# Patient Record
Sex: Male | Born: 1990 | Race: Black or African American | Hispanic: No | Marital: Single | Smoking: Former smoker
Health system: Southern US, Community
[De-identification: ages and names within clinical notes are randomized; demographics above are authoritative.]

## PROBLEM LIST (undated history)

## (undated) DIAGNOSIS — J9383 Other pneumothorax: Secondary | ICD-10-CM

## (undated) DIAGNOSIS — R001 Bradycardia, unspecified: Secondary | ICD-10-CM

---

## 2015-04-14 DIAGNOSIS — J9383 Other pneumothorax: Secondary | ICD-10-CM

## 2015-04-14 HISTORY — DX: Other pneumothorax: J93.83

## 2015-10-09 HISTORY — PX: CHEST TUBE INSERTION: SHX231

## 2015-11-02 ENCOUNTER — Encounter (HOSPITAL_COMMUNITY): Payer: Self-pay | Admitting: Emergency Medicine

## 2015-11-02 ENCOUNTER — Inpatient Hospital Stay (HOSPITAL_COMMUNITY)
Admission: EM | Admit: 2015-11-02 | Discharge: 2015-11-04 | DRG: 201 | Disposition: A | Discharge status: Home / Self Care | Payer: Self-pay | Attending: Pulmonary Disease | Admitting: Pulmonary Disease

## 2015-11-02 ENCOUNTER — Inpatient Hospital Stay (HOSPITAL_COMMUNITY): Payer: Self-pay

## 2015-11-02 ENCOUNTER — Emergency Department (HOSPITAL_COMMUNITY): Payer: Self-pay

## 2015-11-02 DIAGNOSIS — R0789 Other chest pain: Secondary | ICD-10-CM | POA: Diagnosis present

## 2015-11-02 DIAGNOSIS — J939 Pneumothorax, unspecified: Secondary | ICD-10-CM

## 2015-11-02 DIAGNOSIS — J9383 Other pneumothorax: Principal | ICD-10-CM | POA: Diagnosis present

## 2015-11-02 DIAGNOSIS — Z938 Other artificial opening status: Secondary | ICD-10-CM

## 2015-11-02 LAB — BASIC METABOLIC PANEL
Anion gap: 8 (ref 5–15)
BUN: 13 mg/dL (ref 6–20)
CHLORIDE: 103 mmol/L (ref 101–111)
CO2: 27 mmol/L (ref 22–32)
CREATININE: 1.09 mg/dL (ref 0.61–1.24)
Calcium: 9.9 mg/dL (ref 8.9–10.3)
GFR calc Af Amer: >60 mL/min (ref >60)
GFR calc non Af Amer: >60 mL/min (ref >60)
Glucose, Bld: 100 mg/dL — ABNORMAL HIGH (ref 65–99)
Potassium: 3.8 mmol/L (ref 3.5–5.1)
SODIUM: 138 mmol/L (ref 135–145)

## 2015-11-02 LAB — CBC
HCT: 40.3 % (ref 39.0–52.0)
Hemoglobin: 13.2 g/dL (ref 13.0–17.0)
MCH: 23.2 pg — AB (ref 26.0–34.0)
MCHC: 32.8 g/dL (ref 30.0–36.0)
MCV: 70.8 fL — AB (ref 78.0–100.0)
PLATELETS: 259 10*3/uL (ref 150–400)
RBC: 5.69 MIL/uL (ref 4.22–5.81)
RDW: 16.3 % — AB (ref 11.5–15.5)
WBC: 8.1 10*3/uL (ref 4.0–10.5)

## 2015-11-02 LAB — I-STAT TROPONIN, ED: Troponin i, poc: 0.01 ng/mL (ref 0.00–0.08)

## 2015-11-02 MED ORDER — MORPHINE SULFATE (PF) 2 MG/ML IV SOLN
2.0000 mg | INTRAVENOUS | Status: DC | PRN
  Administered 2015-11-02 (×2): 2 mg via INTRAVENOUS
  Filled 2015-11-02 (×2): qty 1

## 2015-11-02 MED ORDER — FENTANYL CITRATE (PF) 100 MCG/2ML IJ SOLN
100.0000 ug | Freq: Once | INTRAMUSCULAR | Status: AC
  Administered 2015-11-02: 50 ug via INTRAVENOUS
  Filled 2015-11-02: qty 2

## 2015-11-02 MED ORDER — ENOXAPARIN SODIUM 40 MG/0.4ML ~~LOC~~ SOLN
40.0000 mg | SUBCUTANEOUS | Status: DC
  Administered 2015-11-02 – 2015-11-03 (×2): 40 mg via SUBCUTANEOUS
  Filled 2015-11-02 (×2): qty 0.4

## 2015-11-02 MED ORDER — MIDAZOLAM HCL 2 MG/2ML IJ SOLN
2.0000 mg | Freq: Once | INTRAMUSCULAR | Status: AC
  Administered 2015-11-02: 1 mg via INTRAVENOUS
  Filled 2015-11-02: qty 2

## 2015-11-02 MED ORDER — ACETAMINOPHEN 325 MG PO TABS: 650.0000 mg | ORAL_TABLET | Freq: Four times a day (QID) | ORAL | Status: DC | PRN

## 2015-11-02 MED ORDER — MIDAZOLAM HCL 2 MG/2ML IJ SOLN
INTRAMUSCULAR | Status: AC | PRN
  Administered 2015-11-02: 1 mg via INTRAVENOUS

## 2015-11-02 MED ORDER — FENTANYL CITRATE (PF) 100 MCG/2ML IJ SOLN
INTRAMUSCULAR | Status: AC | PRN
  Administered 2015-11-02: 50 ug via INTRAVENOUS

## 2015-11-02 MED ORDER — OXYCODONE-ACETAMINOPHEN 5-325 MG PO TABS
1.0000 | ORAL_TABLET | ORAL | Status: DC | PRN
  Administered 2015-11-02 – 2015-11-04 (×6): 2 via ORAL
  Filled 2015-11-02 (×6): qty 2

## 2015-11-02 NOTE — ED Provider Notes (Signed)
[facility]-EMER[facility]ENCY DEPT Provider Note   CSN: [id number] Arrival date & time: 11/02/15  6295  First Provider Contact:  First MD Initiated Contact with Patient 11/02/15 0840        History   Chief Complaint Chief Complaint  Patient presents with  . Chest Pain    HPI [name] [name] is a 25 y.o. male.  The history is provided by the patient.  Chest Pain   This is a new problem. The current episode started 12 to 24 hours ago. The problem occurs constantly. The problem has been gradually worsening. The pain is associated with breathing. The pain is moderate. The quality of the pain is described as sharp and pressure-like. The pain radiates to the upper back. Associated symptoms include back pain and shortness of breath. Pertinent negatives include no abdominal pain, no cough, no diaphoresis, no dizziness and no palpitations.    History reviewed. No pertinent past medical history.  Patient Active Problem List   Diagnosis Date Noted  . Spontaneous pneumothorax 11/02/2015  . S/P chest tube placement [facility])     History reviewed. No pertinent surgical history.     Home Medications    Prior to Admission medications   Medication Sig Start Date End Date Taking? Authorizing Provider  acetaminophen (TYLENOL) 500 MG tablet Take 1,000 mg by mouth every 6 (six) hours as needed (pain).   Yes Historical Provider, MD    Family History History reviewed. No pertinent family history.  Social History Social History  Substance Use Topics  . Smoking status: Never Smoker  . Smokeless tobacco: Never Used  . Alcohol use No     Allergies   Review of patient's allergies indicates no known allergies.   Review of Systems Review of Systems  Constitutional: Negative for diaphoresis.  Respiratory: Positive for shortness of breath. Negative for cough.   Cardiovascular: Positive for chest pain. Negative for palpitations.  Gastrointestinal: Negative for abdominal pain.  Musculoskeletal: Positive  for back pain.  Neurological: Negative for dizziness.  All other systems reviewed and are negative.   Physical Exam Updated Vital Signs BP 128/70 (BP Location: Left Arm)   Pulse 60   Temp 98.5 F (36.9 C) (Oral)   Resp (!) 21   SpO2 100%   Physical Exam  Constitutional: He appears well-developed and well-nourished. No distress.  HENT:  Head: Normocephalic and atraumatic.  Eyes: Conjunctivae are normal.  Neck: Neck supple.  Cardiovascular: Normal rate and regular rhythm.   No murmur heard. Pulmonary/Chest: Effort normal. No respiratory distress. He has decreased breath sounds in the left upper field, the left middle field and the left lower field.  Abdominal: Soft. There is no tenderness.  Musculoskeletal: He exhibits no edema.  Neurological: He is alert.  Skin: Skin is warm and dry.  Psychiatric: He has a normal mood and affect.  Nursing note and vitals reviewed.    ED Treatments / Results  Labs (all labs ordered are listed, but only abnormal results are displayed) Labs Reviewed  BASIC METABOLIC PANEL - Abnormal; Notable for the following:       Result Value   Glucose, Bld 100 (*)    All other components within normal limits  CBC - Abnormal; Notable for the following:    MCV 70.8 (*)    MCH 23.2 (*)    RDW 16.3 (*)    All other components within normal limits  Rosezena Sensor, ED    EKG  EKG Interpretation  Date/Time:  Tuesday November 02 2015 08:04:00  EDT Ventricular Rate:  81 PR Interval:  134 QRS Duration: 84 QT Interval:  360 QTC Calculation: 418 R Axis:   92 Text Interpretation:  Normal sinus rhythm with sinus arrhythmia Right atrial enlargement Rightward axis Possible Anterior infarct , age undetermined Abnormal ECG No old tracing to compare Confirmed by CAMPOS  MD, Caryn Bee ([id number]) on 11/02/2015 8:41:41 AM       Radiology Dg Chest 2 View  Result Date: 11/02/2015 CLINICAL DATA:  Left upper chest pain since last night EXAM: CHEST  2 VIEW COMPARISON:   None. FINDINGS: There is a moderate-sized left pneumothorax, approximately 20%. Lungs are clear. No effusions. Heart is normal size. No acute bony abnormality. IMPRESSION: Moderate-sized left pneumothorax. Critical Value/emergent results were called by telephone at the time of interpretation on 11/02/2015 at 8:22 am to [facility] , who verbally acknowledged these results. Electronically Signed   By: Charlett Nose M.D.   On: 11/02/2015 08:25  Dg Chest Port 1 View  Result Date: 11/02/2015 CLINICAL DATA:  Pneumothorax.  Status post chest tube placement. EXAM: PORTABLE CHEST 1 VIEW COMPARISON:  Two-view chest x-ray 11/02/2015 FINDINGS: The heart size is normal. A left-sided chest tube has been placed. The pneumothorax resolved. No residual pneumothorax is present. There is no significant airspace disease. IMPRESSION: 1. Resolution of pneumothorax following placement of left-sided chest tube. Electronically Signed   By: Marin Roberts M.D.   On: 11/02/2015 11:41   Procedures Procedures (including critical care time)  Medications Ordered in ED Medications  acetaminophen (TYLENOL) tablet 650 mg (not administered)  oxyCODONE-acetaminophen (PERCOCET/ROXICET) 5-325 MG per tablet 1-2 tablet (not administered)  morphine 2 MG/ML injection 2 mg (2 mg Intravenous Given 11/02/15 1126)  enoxaparin (LOVENOX) injection 40 mg (not administered)  midazolam (VERSED) injection 2 mg (1 mg Intravenous Given 11/02/15 1050)  fentaNYL (SUBLIMAZE) injection 100 mcg (50 mcg Intravenous Given 11/02/15 1050)  midazolam (VERSED) injection (1 mg Intravenous Given 11/02/15 1054)  fentaNYL (SUBLIMAZE) injection (50 mcg Intravenous Given 11/02/15 1054)     Initial Impression / Assessment and Plan / ED Course  I have reviewed the triage vital signs and the nursing notes.  Pertinent labs & imaging results that were available during my care of the patient were reviewed by me and considered in my medical decision making (see  chart for details).  Clinical Course    Previously healthy 25 year old male presenting today for left-sided chest pain and shortness of breath. Reports this is been increasing over the last 2-3 days. Chest x-ray performed through triage and found to have moderate-sized left-sided pneumothorax.  On exam patient is imminently stable in no distress and no signs of tension pneumothorax. CBC BMP and troponin unremarkable with unremarkable ECG.  Pulmonary consulted who evaluated the pt in the ED and placed pigtail catheter with alleviation of PTX.     Admitted to pulmonary for further observation.   Labs, ECG, and images were viewed by myself  incorporated into medical decision making.  Discussed pertinent finding with patient or caregiver prior to discharge with no further questions.  Immediate return precautions given and understood.  Medical decision making supervised by my attending Dr. Patria Mane.   Tery Sanfilippo, MD PGY-3 Emergency Medicine     Final Clinical Impressions(s) / ED Diagnoses   Final diagnoses:  Spontaneous pneumothorax  S/P chest tube placement [facility])    New Prescriptions Current Discharge Medication List       Tery Sanfilippo, MD 11/02/15 8329    Azalia Bilis, MD 11/02/15 [phone]

## 2015-11-02 NOTE — ED Notes (Signed)
Patient transported to X-ray 

## 2015-11-02 NOTE — ED Notes (Signed)
Report given to 2w RN

## 2015-11-02 NOTE — ED Notes (Signed)
Placed patient on oxygen [place] (2L) 

## 2015-11-02 NOTE — H&P (Signed)
PULMONARY / CRITICAL CARE MEDICINE   Name: [name] [name] MRN: [id number] DOB: Jul 13, 1990    ADMISSION DATE:  11/02/2015  REFERRING MD:  Dr. Juleen China, ER  CHIEF COMPLAINT:  Chest pain  HISTORY OF PRESENT ILLNESS:   25 yo male was exercising on 7/24 (lifing weights).  During exercise he noticed pain in his left chest.  He had episode like this previously that resolved.  This episode persisted and got progressively worse.  He was also feeling short of breath with activity.  He did not have cough, sputum, fever, hemoptysis, or nausea.  He denies tobacco use or illicit drug use.  There is no hx of recent trauma.  He denies hx of asthma.  He presented to ER and CXR showed 20% Lt pneumothorax.  PAST MEDICAL HISTORY :  He denies history of asthma.  PAST SURGICAL HISTORY: He had vein stripping on his right leg.  No Known Allergies  OUTPATIENT MEDICATIONS: Prn zyrtec  FAMILY HISTORY:  No family history of asthma, emphysema, or pneumothorax.  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  No history of tobacco use or excess alcohol use.  Denies illicit drug use.  REVIEW OF SYSTEMS:   Negative except above.  SUBJECTIVE:  C/o Lt sided chest pain.  VITAL SIGNS: BP 110/68 (BP Location: Right Arm)   Pulse 60   Temp 98.5 F (36.9 C) (Oral)   Resp 19   SpO2 100%   INTAKE / OUTPUT: No intake/output data recorded.  PHYSICAL EXAMINATION: General:  Thin Neuro:  Alert, normal strength, CN intact HEENT:  Pupils reactive, no oral lesions, no LAN Cardiovascular: regular, no murmur Lungs:  Decreased BS on left, no wheeze Abdomen:  Soft, non tender, no organomegaly Musculoskeletal:  No edema, normal muscle tone Skin:  No rashes  LABS:  BMET  Recent Labs Lab 11/02/15 0810  NA 138  K 3.8  CL 103  CO2 27  BUN 13  CREATININE 1.09  GLUCOSE 100*    Electrolytes  Recent Labs Lab 11/02/15 0810  CALCIUM 9.9    CBC  Recent Labs Lab 11/02/15 0810   WBC 8.1  HGB 13.2  HCT 40.3  PLT 259    Imaging Dg Chest 2 View  Result Date: 11/02/2015 CLINICAL DATA:  Left upper chest pain since last night EXAM: CHEST  2 VIEW COMPARISON:  None. FINDINGS: There is a moderate-sized left pneumothorax, approximately 20%. Lungs are clear. No effusions. Heart is normal size. No acute bony abnormality. IMPRESSION: Moderate-sized left pneumothorax. Critical Value/emergent results were called by telephone at the time of interpretation on 11/02/2015 at 8:22 am to [facility] , who verbally acknowledged these results. Electronically Signed   By: Charlett Nose M.D.   On: 11/02/2015 08:25   SIGNIFICANT EVENTS: 7/25 Admit  LINES/TUBES: 7/25 Lt Pigtail chest tube (VS) >>   DISCUSSION: 25 yo male with spontaneous Lt pneumothorax after weight lifting.  ASSESSMENT / PLAN:  Spontaneous Lt pneumothorax. - Wayne catheter pig tail chest tube placed - will keep on minus 20 cm suction - f/u CXR - supplemental oxygen - pain control - monitor on telemetry  Updated pt's mother over the phone.  Coralyn Helling, MD [facility] Pulmonary/Critical Care 11/02/2015, 11:21 AM Pager:  [phone] After 3pm call: [phone]

## 2015-11-02 NOTE — ED Notes (Signed)
Pt given phone to call mother

## 2015-11-02 NOTE — Procedures (Signed)
Chest Tube Insertion Procedure Note  Indications:  Clinically significant Pneumothorax  Pre-operative Diagnosis: Pneumothorax  Post-operative Diagnosis: Pneumothorax  Procedure Details  Informed consent was obtained for the procedure, including sedation.  Risks of lung perforation, hemorrhage, arrhythmia, and adverse drug reaction were discussed.   After sterile skin prep, using standard technique, a 14 French tube was placed in the left lateral  rib space.  Findings: Immediate air rush  Estimated Blood Loss:  Minimal         Specimens:  None              Complications:  None; patient tolerated the procedure well.         Disposition: to medical floor         Condition: stable  Simonne Martinet ACNP-BC [facility] Pulmonary/Critical Care Pager # [phone] OR # [phone] if no answer

## 2015-11-02 NOTE — ED Notes (Signed)
Critical care at beside. Preparing for chest tube placement.

## 2015-11-02 NOTE — Progress Notes (Signed)
Utilization review completed.  

## 2015-11-02 NOTE — ED Notes (Signed)
MD at bedside. 

## 2015-11-02 NOTE — ED Triage Notes (Signed)
Pt sts generalized CP and shoulder pain since working out 3 days ago worse last night; pt sts more painful with movement

## 2015-11-02 NOTE — ED Notes (Signed)
Radiologist called and informed this RN that this patient has a partial (20%) pneumothorax. RN made charge aware and he is to go back next.

## 2015-11-03 ENCOUNTER — Inpatient Hospital Stay (HOSPITAL_COMMUNITY): Payer: Self-pay

## 2015-11-03 DIAGNOSIS — J939 Pneumothorax, unspecified: Secondary | ICD-10-CM

## 2015-11-03 DIAGNOSIS — Z938 Other artificial opening status: Secondary | ICD-10-CM

## 2015-11-03 NOTE — Progress Notes (Signed)
PULMONARY / CRITICAL CARE MEDICINE   Name: [name] [name] MRN: [id number] DOB: 31-May-1990    ADMISSION DATE:  11/02/2015  REFERRING MD:  Dr. Juleen China, ER  CHIEF COMPLAINT:  Chest pain  HISTORY OF PRESENT ILLNESS:   25 yo male was exercising on 7/24 (lifing weights).  During exercise he noticed pain in his left chest.  He had episode like this previously that resolved.  This episode persisted and got progressively worse.  He was also feeling short of breath with activity.  He did not have cough, sputum, fever, hemoptysis, or nausea.  He denies tobacco use or illicit drug use.  There is no hx of recent trauma.  He denies hx of asthma.  He presented to ER and CXR showed 20% Lt pneumothorax.  PAST MEDICAL HISTORY :  He denies history of asthma.  PAST SURGICAL HISTORY: He had vein stripping on his right leg.  No Known Allergies  OUTPATIENT MEDICATIONS: Prn zyrtec  FAMILY HISTORY:  No family history of asthma, emphysema, or pneumothorax.  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  No history of tobacco use or excess alcohol use.  Denies illicit drug use.  REVIEW OF SYSTEMS:   Negative except above.  SUBJECTIVE:  C/o Lt sided chest pain.  VITAL SIGNS: BP 119/77 (BP Location: Left Arm)   Pulse (!) 53   Temp 98 F (36.7 C) (Oral)   Resp 20   SpO2 100%   INTAKE / OUTPUT: I/O last 3 completed shifts: In: 240 [P.O.:240] Out: 600 [Urine:600]  PHYSICAL EXAMINATION: General:  Thin Neuro:  Alert, normal strength, CN intact HEENT:  Pupils reactive, no oral lesions, no LAN Cardiovascular: regular, no murmur Lungs:  Decreased BS on left, no wheeze Abdomen:  Soft, non tender, no organomegaly Musculoskeletal:  No edema, normal muscle tone Skin:  No rashes  LABS:  BMET  Recent Labs Lab 11/02/15 0810  NA 138  K 3.8  CL 103  CO2 27  BUN 13  CREATININE 1.09  GLUCOSE 100*    Electrolytes  Recent Labs Lab 11/02/15 0810  CALCIUM 9.9     CBC  Recent Labs Lab 11/02/15 0810  WBC 8.1  HGB 13.2  HCT 40.3  PLT 259    Imaging Dg Chest Port 1 View  Result Date: 11/03/2015 CLINICAL DATA:  Spontaneous pneumothorax with small caliber chest tube treatment EXAM: PORTABLE CHEST 1 VIEW COMPARISON:  Portable chest x-ray of November 02, 2015 FINDINGS: A less than 5% left apical pneumothorax persists. The small caliber chest tube is in stable position with the pigtail overlying the posterior aspect of the left seventh rib. The right lung is well-expanded and clear. The heart and pulmonary vascularity are normal. The mediastinum is normal in width. There is no pleural effusion. The bony thorax is unremarkable. IMPRESSION: Residual less than 5% left apical pneumothorax. Electronically Signed   By: David  [place] M.D.   On: 11/03/2015 07:29  Dg Chest Port 1 View  Result Date: 11/02/2015 CLINICAL DATA:  Pneumothorax.  Status post chest tube placement. EXAM: PORTABLE CHEST 1 VIEW COMPARISON:  Two-view chest x-ray 11/02/2015 FINDINGS: The heart size is normal. A left-sided chest tube has been placed. The pneumothorax resolved. No residual pneumothorax is present. There is no significant airspace disease. IMPRESSION: 1. Resolution of pneumothorax following placement of left-sided chest tube. Electronically Signed   By: Marin Roberts M.D.   On: 11/02/2015 11:41   SIGNIFICANT EVENTS: 7/25 Admit  LINES/TUBES:+ 7/25 Lt Pigtail chest tube (VS) >>  DISCUSSION: 25 yo male with spontaneous Lt pneumothorax after weight lifting admitted 7/25. Pigtail catheter was inserted with quick resolution of PTX. 7/26 AM residual <5% PTX. CT was placed to waterseal .  ASSESSMENT / PLAN:  Spontaneous Lt pneumothorax. - Wayne catheter pig tail chest tube in place - Chest tube from -20cm suction now to waterseal - f/u CXR in PM and AM - if able to be on waterseal 24 hours with no recurrence of PTX will dc CT in AM.  - supplemental oxygen - pain  control - monitor on telemetry  [name], AGACNP-BC [place] Pulmonology/Critical Care Pager [phone] or [phone]  11/03/2015 11:03 AM   ATTENDING NOTE / ATTESTATION NOTE :   I have discussed the case with the resident/APP  [name].   I agree with the resident/APP's  history, physical examination, assessment, and plans.    I have edited the above note and modified it according to our agreed history, physical examination, assessment and plan.    Family : No family at bedside. Pt updated of pan.   Pt admitted with L PTX, spontaneous. Clinically improved with CT. CXR today > no obvious PTX seen. (-) air leak. On water seal now. Needs f/u CXR. Hopefully CT out in am. No medical insurance. Avoid straining and lifting heavy objects for now.    [name], MD 11/03/2015, 1:21 PM [place] [facility] and Critical [facility] Pager [phone] After 3 pm or if no answer, call [phone]

## 2015-11-04 ENCOUNTER — Inpatient Hospital Stay (HOSPITAL_COMMUNITY): Payer: Self-pay

## 2015-11-04 NOTE — Progress Notes (Signed)
PULMONARY / CRITICAL CARE MEDICINE   Name: [name] [name] MRN: [id number] DOB: December 01, 1990    ADMISSION DATE:  11/02/2015  REFERRING MD:  Dr. Juleen China, ER  CHIEF COMPLAINT:  Chest pain  HISTORY OF PRESENT ILLNESS:   25 yo male was exercising on 7/24 (lifing weights).  During exercise he noticed pain in his left chest.  He had episode like this previously that resolved.  This episode persisted and got progressively worse.  He was also feeling short of breath with activity.  He did not have cough, sputum, fever, hemoptysis, or nausea.  He denies tobacco use or illicit drug use.  There is no hx of recent trauma.  He denies hx of asthma.  He presented to ER and CXR showed 20% Lt pneumothorax.  SUBJECTIVE:  Pain related to chest tube, no other complaints.  VITAL SIGNS: BP 126/81 (BP Location: Left Arm)   Pulse (!) 59   Temp 98.4 F (36.9 C) (Oral)   Resp 20   SpO2 100%   INTAKE / OUTPUT: I/O last 3 completed shifts: In: 480 [P.O.:480] Out: 11 [Chest Tube:11]  PHYSICAL EXAMINATION: General:  Thin Neuro:  Alert, normal strength, CN intact HEENT:  Pupils reactive, no oral lesions, no LAN Cardiovascular: regular, no murmur Lungs:  clear, no wheeze Abdomen:  Soft, non tender, no organomegaly Musculoskeletal:  No edema, normal muscle tone Skin:  No rashes  LABS:  BMET  Recent Labs Lab 11/02/15 0810  NA 138  K 3.8  CL 103  CO2 27  BUN 13  CREATININE 1.09  GLUCOSE 100*    Electrolytes  Recent Labs Lab 11/02/15 0810  CALCIUM 9.9    CBC  Recent Labs Lab 11/02/15 0810  WBC 8.1  HGB 13.2  HCT 40.3  PLT 259    Imaging Dg Chest Port 1 View  Result Date: 11/04/2015 CLINICAL DATA:  Chest tube.  Pneumothorax. EXAM: PORTABLE CHEST 1 VIEW COMPARISON:  11/03/2015. FINDINGS: Left chest tube in stable position. No pneumothorax. Mediastinum hilar structures are unremarkable. Heart size stable. No focal infiltrate. No pleural effusion. IMPRESSION: Left chest tube in  stable position.  No pneumothorax. Electronically Signed   By: Maisie Fus  Register   On: 11/04/2015 07:54  Dg Chest Port 1 View  Result Date: 11/03/2015 CLINICAL DATA:  Pneumothorax EXAM: PORTABLE CHEST 1 VIEW COMPARISON:  11/03/2015 FINDINGS: There is a left pleural pigtail catheter in unchanged position. There is no residual pneumothorax identified. There is no focal parenchymal opacity. There is no pleural effusion. The heart and mediastinal contours are unremarkable. The osseous structures are unremarkable. IMPRESSION: Left pleural pigtail catheter without a residual left pneumothorax. Electronically Signed   By: Elige Ko   On: 11/03/2015 16:04   SIGNIFICANT EVENTS: 7/25 Admit  LINES/TUBES:+ 7/25 Lt Pigtail chest tube (VS) >> 7/27  DISCUSSION: 25 yo male with spontaneous Lt pneumothorax after weight lifting admitted 7/25. Pigtail catheter was inserted with quick resolution of PTX. 7/26 AM residual <5% PTX. CT was placed to waterseal. Repeat CXR that day and the following morning continued to show resolution of pneumothorax. 7/27 AM chest tube was removed.   ASSESSMENT / PLAN:  Spontaneous Lt pneumothorax. - DC chest tube - f/u CXR in 1500 - monitor on telemetry - Can d/c to home if PM CXR is without PTX  [name], AGACNP-BC [facility] Pulmonology/Critical Care Pager [phone] or [phone]  11/04/2015 10:23 AM    ATTENDING NOTE / ATTESTATION NOTE :   I have discussed the case with  the resident/APP [name]  I agree with the resident/APP's  history, physical examination, assessment, and plans.    Pt admitted with L PTX, spontaneous. Clinically improved with CT. CXR today after being on water seal for 24 hrs > no PTX seen. (-) air leak. Pt comfortable. VSS. CTA. (-) crepitus. Plan to d/c CT today and rpt CXR later today.  If no PTX, plan to dc home. D/w pt regarding avoiding strainig and lifting heavy objects for now.   I have edited the above note and modified  it according to our agreed history, physical examination, assessment and plan.    Family : No family at bedside. Pt updated on plans.    [name], MD 11/04/2015, 12:43 PM [place] Pulmonary and Critical Care Pager [phone] After 3 pm or if no answer, call [phone]

## 2015-11-04 NOTE — Discharge Summary (Signed)
Physician Discharge Summary       Patient ID: [name] [name] MRN: [id number] DOB/AGE: [birth year] 25 y.o.  Admit date: 11/02/2015 Discharge date: 11/04/2015  Discharge Diagnoses:  Active Problems:   Spontaneous pneumothorax   Pneumothorax, left   History of Present Illness: 25 yo male was exercising on 7/24 (lifing weights).  During exercise he noticed pain in his left chest.  He had episode like this previously that resolved.  This episode persisted and got progressively worse.  He was also feeling short of breath with activity.  He did not have cough, sputum, fever, hemoptysis, or nausea.  He denies tobacco use or illicit drug use.  There is no hx of recent trauma.  He denies hx of asthma.  He presented to ER and CXR showed 20% Lt pneumothorax.  Hospital Course:  In the emergency department he had L sided wayne catheter chest tube placed and hooked to continuous suction via pleura-vac device with quick resolution of pneumothorax. 7/26 with full resolution of PTX, chest tube was placed to water seal. 7/27 AM films still with full resolution of pneumothorax. Chest tube removed. Repeat film 7/27 PM with fully expanded lung. [name] [name] discharged to home.    Discharge Plan by active problems   Left sided pneumothorax: - Full resolution of CXR - Avoid heavy lifting (10lbs or more), overexertion for one week - Follow up with PCP as needed - Return to emergency department for any dyspnea or chest pain.     Discharge Exam: BP 129/68 (BP Location: Left Arm)   Pulse (!) 57   Temp 98.3 F (36.8 C) (Oral)   Resp 18   SpO2 97%   General:  Thin male in NAD Neuro:  Alert, normal strength, CN intact HEENT:  Pupils reactive, no oral lesions, no LAN Cardiovascular: regular, no murmur Lungs:  clear, no wheeze Abdomen:  Soft, non tender, no organomegaly Musculoskeletal:  No edema, normal muscle tone Skin:  No rashes  Labs at discharge Lab Results  Component Value Date   CREATININE  1.09 11/02/2015   BUN 13 11/02/2015   NA 138 11/02/2015   K 3.8 11/02/2015   CL 103 11/02/2015   CO2 27 11/02/2015   Lab Results  Component Value Date   WBC 8.1 11/02/2015   HGB 13.2 11/02/2015   HCT 40.3 11/02/2015   MCV 70.8 (L) 11/02/2015   PLT 259 11/02/2015   No results found for: ALT, AST, GGT, ALKPHOS, BILITOT No results found for: INR, PROTIME  Current radiology studies Dg Chest Port 1 View  Result Date: 11/04/2015 CLINICAL DATA:  Followup left-sided pneumothorax. Chest tube removed. EXAM: PORTABLE CHEST 1 VIEW COMPARISON:  11/04/2015 at 6:44 a.m. FINDINGS: No left pneumothorax following left-sided chest tube removal. Lungs are clear. No pleural effusions. Normal heart, mediastinum and hila. IMPRESSION: 1. Status post left-sided chest tube removal.  No pneumothorax. 2. No active cardiopulmonary disease. Electronically Signed   By: Amie Portland M.D.   On: 11/04/2015 16:32  Dg Chest Port 1 View  Result Date: 11/04/2015 CLINICAL DATA:  Chest tube.  Pneumothorax. EXAM: PORTABLE CHEST 1 VIEW COMPARISON:  11/03/2015. FINDINGS: Left chest tube in stable position. No pneumothorax. Mediastinum hilar structures are unremarkable. Heart size stable. No focal infiltrate. No pleural effusion. IMPRESSION: Left chest tube in stable position.  No pneumothorax. Electronically Signed   By: Maisie Fus  Register   On: 11/04/2015 07:54  Dg Chest Port 1 View  Result Date: 11/03/2015 CLINICAL DATA:  Pneumothorax EXAM: PORTABLE CHEST 1  VIEW COMPARISON:  11/03/2015 FINDINGS: There is a left pleural pigtail catheter in unchanged position. There is no residual pneumothorax identified. There is no focal parenchymal opacity. There is no pleural effusion. The heart and mediastinal contours are unremarkable. The osseous structures are unremarkable. IMPRESSION: Left pleural pigtail catheter without a residual left pneumothorax. Electronically Signed   By: Elige Ko   On: 11/03/2015 16:04  Dg Chest Port 1  View  Result Date: 11/03/2015 CLINICAL DATA:  Spontaneous pneumothorax with small caliber chest tube treatment EXAM: PORTABLE CHEST 1 VIEW COMPARISON:  Portable chest x-ray of November 02, 2015 FINDINGS: A less than 5% left apical pneumothorax persists. The small caliber chest tube is in stable position with the pigtail overlying the posterior aspect of the left seventh rib. The right lung is well-expanded and clear. The heart and pulmonary vascularity are normal. The mediastinum is normal in width. There is no pleural effusion. The bony thorax is unremarkable. IMPRESSION: Residual less than 5% left apical pneumothorax. Electronically Signed   By: David  [place]aziland M.D.   On: 11/03/2015 07:29   Disposition:  Final discharge disposition not confirmed  Discharge Instructions    Call MD for:  difficulty breathing, headache or visual disturbances    Complete by:  As directed   Call MD for:  severe uncontrolled pain    Complete by:  As directed   Diet - low sodium heart healthy    Complete by:  As directed   Discharge instructions    Complete by:  As directed   - Avoid heavy lifting (10lbs or more), overexertion for one week - Follow up with family doctor as needed - Return to emergency department for any trouble breathing or chest pain.   Increase activity slowly    Complete by:  As directed   Remove dressing in 48 hours    Complete by:  As directed       Medication List    TAKE these medications   acetaminophen 500 MG tablet Commonly known as:  TYLENOL Take 1,000 mg by mouth every 6 (six) hours as needed (pain).        Discharged Condition: good  Greater than 35 minutes of time have been dedicated to discharge assessment, planning and discharge instructions.   Signed: [name], AGACNP-BC [facility] Pulmonology/Critical Care Pager [phone] or [phone]  11/04/2015 4:36 PM

## 2015-11-04 NOTE — Care Management Note (Signed)
Case Management Note Donn Pierini RN, BSN Unit 2W-Case Manager [phone]  Patient Details  Name: [name] [name] MRN: [id number] Date of Birth: 08/30/1990  Subjective/Objective:    Pt admitted with spont. pntx- chest tube placed                Action/Plan: PTA Pt lived at home- independent- anticipate return home- CM will follow  Expected Discharge Date:                  Expected Discharge Plan:  Home/Self Care  In-House Referral:     Discharge planning Services  CM Consult  Post Acute Care Choice:    Choice offered to:     DME Arranged:    DME Agency:     HH Arranged:    HH Agency:     Status of Service:  In process, will continue to follow  If discussed at Long Length of Stay Meetings, dates discussed:    Additional Comments:  Darrold Span, RN 11/04/2015, 12:07 PM

## 2016-04-13 ENCOUNTER — Encounter (HOSPITAL_COMMUNITY): Payer: Self-pay | Admitting: *Deleted

## 2016-04-13 ENCOUNTER — Emergency Department (HOSPITAL_COMMUNITY): Payer: BLUE CROSS/BLUE SHIELD

## 2016-04-13 ENCOUNTER — Inpatient Hospital Stay (HOSPITAL_COMMUNITY)
Admission: EM | Admit: 2016-04-13 | Discharge: 2016-04-16 | DRG: 201 | Disposition: A | Discharge status: Home / Self Care | Payer: BLUE CROSS/BLUE SHIELD | Attending: Internal Medicine | Admitting: Internal Medicine

## 2016-04-13 ENCOUNTER — Inpatient Hospital Stay (HOSPITAL_COMMUNITY): Payer: BLUE CROSS/BLUE SHIELD

## 2016-04-13 DIAGNOSIS — J9383 Other pneumothorax: Secondary | ICD-10-CM | POA: Diagnosis not present

## 2016-04-13 DIAGNOSIS — R079 Chest pain, unspecified: Secondary | ICD-10-CM | POA: Diagnosis not present

## 2016-04-13 DIAGNOSIS — J939 Pneumothorax, unspecified: Secondary | ICD-10-CM

## 2016-04-13 DIAGNOSIS — R0600 Dyspnea, unspecified: Secondary | ICD-10-CM | POA: Diagnosis not present

## 2016-04-13 HISTORY — DX: Other pneumothorax: J93.83

## 2016-04-13 LAB — CBC WITH DIFFERENTIAL/PLATELET
BASOS ABS: 0 10*3/uL (ref 0.0–0.1)
Basophils Relative: 0 %
Eosinophils Absolute: 0.1 10*3/uL (ref 0.0–0.7)
Eosinophils Relative: 1 %
HCT: 42.5 % (ref 39.0–52.0)
HEMOGLOBIN: 13.9 g/dL (ref 13.0–17.0)
LYMPHS ABS: 2.2 10*3/uL (ref 0.7–4.0)
LYMPHS PCT: 40 %
MCH: 23.5 pg — AB (ref 26.0–34.0)
MCHC: 32.7 g/dL (ref 30.0–36.0)
MCV: 71.8 fL — AB (ref 78.0–100.0)
Monocytes Absolute: 0.4 10*3/uL (ref 0.1–1.0)
Monocytes Relative: 6 %
NEUTROS PCT: 53 %
Neutro Abs: 2.9 10*3/uL (ref 1.7–7.7)
Platelets: 230 10*3/uL (ref 150–400)
RBC: 5.92 MIL/uL — AB (ref 4.22–5.81)
RDW: 16.6 % — ABNORMAL HIGH (ref 11.5–15.5)
WBC: 5.5 10*3/uL (ref 4.0–10.5)

## 2016-04-13 LAB — COMPREHENSIVE METABOLIC PANEL
ALK PHOS: 50 U/L (ref 38–126)
ALT: 24 U/L (ref 17–63)
AST: 30 U/L (ref 15–41)
Albumin: 4.8 g/dL (ref 3.5–5.0)
Anion gap: 10 (ref 5–15)
BUN: 13 mg/dL (ref 6–20)
CALCIUM: 10 mg/dL (ref 8.9–10.3)
CHLORIDE: 103 mmol/L (ref 101–111)
CO2: 24 mmol/L (ref 22–32)
Creatinine, Ser: 1.13 mg/dL (ref 0.61–1.24)
GFR calc non Af Amer: >60 mL/min (ref >60)
Glucose, Bld: 89 mg/dL (ref 65–99)
Potassium: 3.9 mmol/L (ref 3.5–5.1)
SODIUM: 137 mmol/L (ref 135–145)
Total Bilirubin: 0.8 mg/dL (ref 0.3–1.2)
Total Protein: 7.5 g/dL (ref 6.5–8.1)

## 2016-04-13 LAB — PROTIME-INR
INR: 1.06
Prothrombin Time: 13.8 seconds (ref 11.4–15.2)

## 2016-04-13 MED ORDER — HYDROCODONE-ACETAMINOPHEN 5-325 MG PO TABS: 1.0000 | ORAL_TABLET | Freq: Four times a day (QID) | ORAL | Status: DC | PRN

## 2016-04-13 MED ORDER — HEPARIN SODIUM (PORCINE) 5000 UNIT/ML IJ SOLN
5000.0000 [IU] | Freq: Three times a day (TID) | INTRAMUSCULAR | Status: DC
  Administered 2016-04-13 – 2016-04-16 (×10): 5000 [IU] via SUBCUTANEOUS
  Filled 2016-04-13 (×8): qty 1

## 2016-04-13 NOTE — H&P (Signed)
Name: [name] [name] MRN: [id number] DOB: [birth year]    ADMISSION DATE:  04/13/2016  REFERRING MD :  EDP   CHIEF COMPLAINT:  Spontaneous ptx   BRIEF PATIENT DESCRIPTION: 26yo male with hx multiple spontaneous L ptx x 2 (July 2017, December 2017) presented 1/4 with chest pain radiating to L shoulder and SOB.   CXR revealed 10% R apical ptx.  PCCM called to admit.   SIGNIFICANT EVENTS    STUDIES:  CT chest 1/4>>>   HISTORY OF PRESENT ILLNESS:  25yo male with hx multiple spontaneous L ptx x 2 (July 2017, December 2017) presented 1/4 with chest pain radiating to L shoulder and SOB.   CXR revealed 10% R apical ptx.  PCCM called to admit.  Pt admitted in July with same.  Treated with pigtail cath and resolved. Readmitted in [place] 2 weeks ago, pigtail placed.  Took 2 weeks off work. Went back to work today and had L chest pain radiation to shoulder and "knew" it was the same.  Denies dyspnea, hemoptysis.  No heavy lifting prior.  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  Never smoker   PAST MEDICAL HISTORY :   has no past medical history on file.  has no past surgical history on file. Prior to Admission medications   Medication Sig Start Date End Date Taking? Authorizing Provider  acetaminophen (TYLENOL) 500 MG tablet Take 1,000 mg by mouth every 6 (six) hours as needed (pain).    Historical Provider, MD   No Known Allergies  FAMILY HISTORY:  family history is not on file. SOCIAL HISTORY:  reports that he has never smoked. He has never used smokeless tobacco. He reports that he does not drink alcohol or use drugs.  REVIEW OF SYSTEMS:   As per HPI - All other systems reviewed and were neg.    SUBJECTIVE:   VITAL SIGNS: Temp:  [98.6 F (37 C)] 98.6 F (37 C) (01/04 0935) Pulse Rate:  [54-89] 54 (01/04 1130) Resp:  [12-22] 17 (01/04 1130) BP: (118-142)/(71-98) 118/71 (01/04 1130) SpO2:  [99 %-100 %] 99 % (01/04 1130)  PHYSICAL EXAMINATION: General:  Very pleasant young,tall,  thin male, NAD  Neuro:  Awake, alert, appropriate, MAE  HEENT:  Mm moist, no JVD    Cardiovascular:  s1s2 rrr Lungs:  resps even non labored on RA, good breath sounds bilat  Abdomen:  Soft, non tender  Musculoskeletal:  Warm and dry, no edema     Recent Labs Lab 04/13/16 1032  NA 137  K 3.9  CL 103  CO2 24  BUN 13  CREATININE 1.13  GLUCOSE 89    Recent Labs Lab 04/13/16 1032  HGB 13.9  HCT 42.5  WBC 5.5  PLT 230   Dg Chest 2 View  Result Date: 04/13/2016 CLINICAL DATA:  Left-sided chest pain EXAM: CHEST  2 VIEW COMPARISON:  11/04/2015 FINDINGS: There is a small left apical pneumothorax measuring less than 10%. There is no focal parenchymal opacity. There is no pleural effusion. There is no right pneumothorax. The heart and mediastinal contours are unremarkable. The osseous structures are unremarkable. IMPRESSION: Small left apical pneumothorax measuring less than 10%. Critical Value/emergent results were called by telephone at the time of interpretation on 04/13/2016 at 10:02 am to Dr. Marily MemosJASON MESNER , who verbally acknowledged these results. Electronically Signed   By: Elige KoHetal  Patel   On: 04/13/2016 10:10   Ct Chest Wo Contrast  Result Date: 04/13/2016 CLINICAL DATA:  Left-sided chest pain EXAM:  CT CHEST WITHOUT CONTRAST TECHNIQUE: Multidetector CT imaging of the chest was performed following the standard protocol without IV contrast. COMPARISON:  None. FINDINGS: Cardiovascular: No significant vascular findings. Normal heart size. No pericardial effusion. Mediastinum/Nodes: No enlarged mediastinal or axillary lymph nodes. Thyroid gland, trachea, and esophagus demonstrate no significant findings. Lungs/Pleura: Lungs are clear. No pneumothorax. No focal consolidation. Small apical pneumothorax measuring less than 10%. Upper Abdomen: No acute abnormality. Musculoskeletal: No chest wall mass or suspicious bone lesions identified. IMPRESSION: 1. Small left apical pneumothorax measuring less  than 10%. No apical bleb or bulla. 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 This time small, <10%, pt comfortable.   PLAN -  Admit to med-surg  Will hold off on chest tube for now CXR this pm and again in am  Place on NRB  Will ask [facility] to see given frequent recurrence - ?needs pleurodesis    Dirk Dress, NP 04/13/2016  12:02 PM Pager: [phone] or [phone]  ATTENDING NOTE / ATTESTATION NOTE :   I have discussed the case with the resident/APP Dirk Dress NP  I agree with the resident/APP's  history, physical examination, assessment, and plans.    I have edited the above note and modified it according to our agreed history, physical examination, assessment and plan.   Briefly, nonsmoker, young male, admitted for the third time for a pneumothorax on the left. He was admitted in July 2017 for left pneumothorax for which he had chest tube placed. He improved after several days and was discharged. Went back to baseline. During Christmas time, he overexerted himself and had left chest pain. He was in [place] at that time. He was diagnosed with pneumothorax again in the left lung. He was discharged after 3 days. He was taking it easy until 2 days ago. He started working and overexerting himself. Had some chest discomfort on the left. It persisted so he went to the emergency room. At the emergency room, chest x-ray showed a small left apical pneumothorax. Chest CT scan showed the same. No bullae or cysts seen on the chest CT scan.  Currently, he is comfortable. Not in distress. Vital signs stables. No subcutaneous air. No crepitus. No prominent neck veins. Good air entry. Clear to auscultation. Good S1 and S2. No murmur rub or gallop. Abdomen was benign. No edema.   Labs reviewed. CT scan with L < 10% PTX.   Assessment/Plan : Recurrent left pneumothorax. Currently,  has a left pneumothorax which is usually provoked by overexertion. It is less than 10% right now. Patient is comfortable. Plan to repeat x-ray. We'll place on nonrebreather mask. Repeat x-ray in the morning as well. Hopefully pneumothorax will improve. If he worsens, he might need a chest tube. We consulted [facility] >> if patient ends up not getting a chest tube and he gets discharged, they'll see him as an outpatient on Monday and schedule elective VATS/decortication. Not sure what triggers the PTX other than "over exertion".  Will check drug screen (he denies snorting drugs). Will check echo as well (check for congenital anomalies).    Family    No family at bedside.    [name], MD 04/13/2016, 4:56 PM [facility] Pager [phone] After 3 pm or if no answer, call [phone]

## 2016-04-13 NOTE — ED Notes (Signed)
Patient transported to X-ray 

## 2016-04-13 NOTE — ED Triage Notes (Signed)
Pt has c/o left sided CP that radiates to the left shoulder and is accompanied by SOB. Pt states he had a spontaneous pneumothorax two weeks ago.

## 2016-04-13 NOTE — ED Notes (Signed)
Placed patient on the monitor did ekg shown to Dr Erin Hearingmessner placed into a gown

## 2016-04-13 NOTE — ED Provider Notes (Signed)
[facility]Y DEPT Provider Note   CSN: [id number] Arrival date & time: 04/13/16  0917     History   Chief Complaint Chief Complaint  Patient presents with  . Chest Pain  . Shortness of Breath    HPI [name] [name] is a 26 y.o. male.  26 year old male has a history of multiple spontaneous pneumothoraxes in the last 6 months presents with acute onset of chest pain last night around 5:30. Progressively worsened with activity last night however seem to be a little better this morning. It up and moves around and seemed to recur had a little bit of dyspnea with it and felt similar to his previous pneumothoraces so he came here for evaluation. Denies any trauma. Says he took a week off after his last pneumothorax, was discharged from [facility] on [facility], does not smoke or have any drug habits. States he did a CT scan his last hospital visit and didn't show any blebs or cysts or any other causes for his recurrent pneumonias. States that when he left there on the 26th of his pneumothorax had totally resolved.      Past Medical History:  Diagnosis Date  . Spontaneous pneumothorax 04/14/2015   previous pneumothorax  in 10/2015    Patient Active Problem List   Diagnosis Date Noted  . Pneumothorax, left   . Spontaneous pneumothorax 11/02/2015  . S/P chest tube placement [facility])     Past Surgical History:  Procedure Laterality Date  . CHEST TUBE INSERTION Left 10/2015       Home Medications    Prior to Admission medications   Not on File    Family History History reviewed. No pertinent family history.  Social History Social History  Substance Use Topics  . Smoking status: Never Smoker  . Smokeless tobacco: Never Used  . Alcohol use No     Allergies   Patient has no known allergies.   Review of Systems Review of Systems  Respiratory: Positive for shortness of breath.   Cardiovascular: Positive for chest pain.  All other systems reviewed and are  negative.    Physical Exam Updated Vital Signs BP 121/80   Pulse (!) 59   Temp 97.8 F (36.6 C)   Resp 18   Ht 6' (1.829 m)   Wt 135 lb (61.2 kg)   SpO2 97%   BMI 18.31 kg/m   Physical Exam  Constitutional: He is oriented to person, place, and time. He appears well-developed and well-nourished.  HENT:  Head: Normocephalic and atraumatic.  Eyes: Conjunctivae and EOM are normal.  Neck: Normal range of motion.  Cardiovascular: Normal rate.   Pulmonary/Chest: Effort normal and breath sounds normal. No respiratory distress. He has no wheezes. He has no rales.  Abdominal: Soft. He exhibits no distension.  Musculoskeletal: Normal range of motion. He exhibits no edema, tenderness or deformity.  Neurological: He is alert and oriented to person, place, and time. No cranial nerve deficit.  Skin: Skin is warm and dry.  Nursing note and vitals reviewed.    ED Treatments / Results  Labs (all labs ordered are listed, but only abnormal results are displayed) Labs Reviewed  CBC WITH DIFFERENTIAL/PLATELET - Abnormal; Notable for the following:       Result Value   RBC 5.92 (*)    MCV 71.8 (*)    MCH 23.5 (*)    RDW 16.6 (*)    All other components within normal limits  COMPREHENSIVE METABOLIC PANEL  PROTIME-INR  RAPID  URINE DRUG SCREEN, HOSP PERFORMED    EKG  EKG Interpretation None       Radiology Dg Chest 2 View  Result Date: 04/13/2016 CLINICAL DATA:  Left-sided chest pain EXAM: CHEST  2 VIEW COMPARISON:  11/04/2015 FINDINGS: There is a small left apical pneumothorax measuring less than 10%. There is no focal parenchymal opacity. There is no pleural effusion. There is no right pneumothorax. The heart and mediastinal contours are unremarkable. The osseous structures are unremarkable. IMPRESSION: Small left apical pneumothorax measuring less than 10%. Critical Value/emergent results were called by telephone at the time of interpretation on 04/13/2016 at 10:02 am to Dr. Marily MemosJASON  Favor Kreh , who verbally acknowledged these results. Electronically Signed   By: Elige KoHetal  Patel   On: 04/13/2016 10:10   Ct Chest Wo Contrast  Result Date: 04/13/2016 CLINICAL DATA:  Left-sided chest pain EXAM: CT CHEST WITHOUT CONTRAST TECHNIQUE: Multidetector CT imaging of the chest was performed following the standard protocol without IV contrast. COMPARISON:  None. FINDINGS: Cardiovascular: No significant vascular findings. Normal heart size. No pericardial effusion. Mediastinum/Nodes: No enlarged mediastinal or axillary lymph nodes. Thyroid gland, trachea, and esophagus demonstrate no significant findings. Lungs/Pleura: Lungs are clear. No pneumothorax. No focal consolidation. Small apical pneumothorax measuring less than 10%. Upper Abdomen: No acute abnormality. Musculoskeletal: No chest wall mass or suspicious bone lesions identified. IMPRESSION: 1. Small left apical pneumothorax measuring less than 10%. No apical bleb or bulla. Electronically Signed   By: Elige KoHetal  Patel   On: 04/13/2016 11:50   Portable Chest 1 View  Result Date: 04/13/2016 CLINICAL DATA:  Onset of severe left-sided chest pain today. History of spontaneous pneumothorax treated with chest tube in the past. EXAM: PORTABLE CHEST 1 VIEW COMPARISON:  Chest x-ray and chest CT scan of April 13, 2016 FINDINGS: The 10% or less left apical pneumothorax is again demonstrated. Elsewhere the lungs are unremarkable. The heart and pulmonary vascularity are normal. The mediastinum is normal in width and position. The bony thorax exhibits no acute abnormality. IMPRESSION: 10% or less left apical pneumothorax not significantly changed since the earlier study. Electronically Signed   By: David  [place]il[place] M.D.   On: 04/13/2016 16:22    Procedures Procedures (including critical care time)  Medications Ordered in ED Medications  heparin injection 5,000 Units (5,000 Units Subcutaneous Given 04/14/16 [id number])  HYDROcodone-acetaminophen (NORCO/VICODIN) 5-325 MG per  tablet 1-2 tablet (not administered)     Initial Impression / Assessment and Plan / ED Course  I have reviewed the triage vital signs and the nursing notes.  Pertinent labs & imaging results that were available during my care of the patient were reviewed by me and considered in my medical decision making (see chart for details).  Clinical Course    Recurrent PTX. Appears pulmonary saw him last time, so will consult again. May need CT consult.   Pulmonary will admit.   Final Clinical Impressions(s) / ED Diagnoses   Final diagnoses:  Spontaneous pneumothorax     Marily MemosJason Zoella Roberti, MD 04/14/16 [phone]

## 2016-04-14 ENCOUNTER — Inpatient Hospital Stay (HOSPITAL_BASED_OUTPATIENT_CLINIC_OR_DEPARTMENT_OTHER): Payer: BLUE CROSS/BLUE SHIELD

## 2016-04-14 ENCOUNTER — Inpatient Hospital Stay (HOSPITAL_COMMUNITY): Payer: BLUE CROSS/BLUE SHIELD

## 2016-04-14 DIAGNOSIS — J939 Pneumothorax, unspecified: Secondary | ICD-10-CM | POA: Diagnosis present

## 2016-04-14 DIAGNOSIS — R0600 Dyspnea, unspecified: Secondary | ICD-10-CM | POA: Diagnosis not present

## 2016-04-14 LAB — RAPID URINE DRUG SCREEN, HOSP PERFORMED
Amphetamines: NOT DETECTED
BARBITURATES: NOT DETECTED
Benzodiazepines: NOT DETECTED
COCAINE: NOT DETECTED
Opiates: NOT DETECTED
TETRAHYDROCANNABINOL: POSITIVE — AB

## 2016-04-14 LAB — ECHOCARDIOGRAM COMPLETE
Height: 72 in
Weight: 2160 oz

## 2016-04-14 NOTE — Care Management Note (Signed)
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 This time small, <10%, pt comfortable.     Will hold off on chest tube for now  Place on NRB  Will ask [facility] to see given frequent recurrence - ?needs pleurodesis   Expected Discharge Date:                  Expected Discharge Plan:  Home/Self Care  In-House Referral:     Discharge planning Services     Post Acute Care Choice:    Choice offered to:     DME Arranged:    DME Agency:     HH Arranged:    HH Agency:     Status of Service:  In process, will continue to follow  If discussed at Long Length of Stay Meetings, dates discussed:    Additional Comments:  Kingsley PlanWile, Benjamen Koelling Marie, RN 04/14/2016, 9:43 AM

## 2016-04-14 NOTE — Progress Notes (Signed)
  Echocardiogram 2D Echocardiogram has been performed.  [name] [name] L Androw 04/14/2016, 1:59 PM

## 2016-04-14 NOTE — Progress Notes (Signed)
RT placed pt on venturi mask 12 L 50% per NP order. Pt tolerating well.  RN notified.

## 2016-04-14 NOTE — Progress Notes (Signed)
Name: [name] [name] MRN: [id number] DOB: [birth year]    ADMISSION DATE:  04/13/2016  REFERRING MD :  EDP   CHIEF COMPLAINT:  Spontaneous ptx   BRIEF PATIENT DESCRIPTION: 26yo male with hx multiple spontaneous L ptx x 2 (July 2017, December 2017) presented 1/4 with chest pain radiating to L shoulder and SOB.   CXR revealed 10% R apical ptx.  PCCM called to admit.   SIGNIFICANT EVENTS    STUDIES:  CT chest 1/4>>>   HISTORY OF PRESENT ILLNESS:  25yo male with hx multiple spontaneous L ptx x 2 (July 2017, December 2017) presented 1/4 with chest pain radiating to L shoulder and SOB.   CXR revealed 10% R apical ptx.  PCCM called to admit.  Pt admitted in July with same.  Treated with pigtail cath and resolved. Readmitted in [place] 2 weeks ago, pigtail placed.  Took 2 weeks off work. Went back to work today and had L chest pain radiation to shoulder and "knew" it was the same.  Denies dyspnea, hemoptysis.  No heavy lifting prior.  Works as Merchandiser, retail at [employer].  Never smoker      SUBJECTIVE: NAD, Not on O2 as ordered  VITAL SIGNS: Temp:  [97.8 F (36.6 C)-98.6 F (37 C)] 97.8 F (36.6 C) (01/05 0546) Pulse Rate:  [54-61] 59 (01/05 0546) Resp:  [17-18] 18 (01/05 0546) BP: (107-121)/(66-80) 121/80 (01/05 0546) SpO2:  [97 %-99 %] 97 % (01/05 0546) Weight:  [135 lb (61.2 kg)] 135 lb (61.2 kg) (01/04 1309)  PHYSICAL EXAMINATION: General:  Very pleasant young,tall, thin male, NAD  Neuro:  Awake, alert, appropriate, MAE  HEENT:  Mm moist, no JVD    Cardiovascular:  s1s2 rrr Lungs:  resps even non labored on RA, good breath sounds bilat . Left chest with 2 previous needle chest tubes sites Abdomen:  Soft, non tender  Musculoskeletal:  Warm and dry, no edema     Recent Labs Lab 04/13/16 1032  NA 137  K 3.9  CL 103  CO2 24  BUN 13  CREATININE 1.13  GLUCOSE 89    Recent Labs Lab 04/13/16 1032  HGB 13.9  HCT 42.5  WBC 5.5  PLT 230   Dg Chest 2 View  Result  Date: 04/13/2016 CLINICAL DATA:  Left-sided chest pain EXAM: CHEST  2 VIEW COMPARISON:  11/04/2015 FINDINGS: There is a small left apical pneumothorax measuring less than 10%. There is no focal parenchymal opacity. There is no pleural effusion. There is no right pneumothorax. The heart and mediastinal contours are unremarkable. The osseous structures are unremarkable. IMPRESSION: Small left apical pneumothorax measuring less than 10%. Critical Value/emergent results were called by telephone at the time of interpretation on 04/13/2016 at 10:02 am to Dr. Marily Memos , who verbally acknowledged these results. Electronically Signed   By: Elige Ko   On: 04/13/2016 10:10   Ct Chest Wo Contrast  Result Date: 04/13/2016 CLINICAL DATA:  Left-sided chest pain EXAM: CT CHEST WITHOUT CONTRAST TECHNIQUE: Multidetector CT imaging of the chest was performed following the standard protocol without IV contrast. COMPARISON:  None. FINDINGS: Cardiovascular: No significant vascular findings. Normal heart size. No pericardial effusion. Mediastinum/Nodes: No enlarged mediastinal or axillary lymph nodes. Thyroid gland, trachea, and esophagus demonstrate no significant findings. Lungs/Pleura: Lungs are clear. No pneumothorax. No focal consolidation. Small apical pneumothorax measuring less than 10%. Upper Abdomen: No acute abnormality. Musculoskeletal: No chest wall mass or suspicious bone lesions identified. IMPRESSION: 1. Small left apical pneumothorax  measuring less than 10%. No apical bleb or bulla. Electronically Signed   By: Elige KoHetal  Patel   On: 04/13/2016 11:50   Dg Chest Portable 1 View  Result Date: 04/14/2016 CLINICAL DATA:  Spontaneous pneumothorax. EXAM: PORTABLE CHEST 1 VIEW COMPARISON:  04/13/2016 FINDINGS: There is no focal parenchymal opacity. There is a small persistent left apical pneumothorax measuring less than 10% without significant interval change accounting for differences in obliquity. The heart and  mediastinal contours are unremarkable. The osseous structures are unremarkable. IMPRESSION: Stable, small left apical pneumothorax measuring less than 10%. No significant interval change. Electronically Signed   By: Elige KoHetal  Patel   On: 04/14/2016 08:31   Portable Chest 1 View  Result Date: 04/13/2016 CLINICAL DATA:  Onset of severe left-sided chest pain today. History of spontaneous pneumothorax treated with chest tube in the past. EXAM: PORTABLE CHEST 1 VIEW COMPARISON:  Chest x-ray and chest CT scan of April 13, 2016 FINDINGS: The 10% or less left apical pneumothorax is again demonstrated. Elsewhere the lungs are unremarkable. The heart and pulmonary vascularity are normal. The mediastinum is normal in width and position. The bony thorax exhibits no acute abnormality. IMPRESSION: 10% or less left apical pneumothorax not significantly changed since the earlier study. Electronically Signed   By: David  Swazil[place]Jordan M.D.   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 This time small, <10%, pt comfortable.   PLAN -  Admit to med-surg  Will hold off on chest tube for now Start high flow O2 now. Spoke to [employer]. Has not been done as of 1/5 1130 am. PNX remains on CxR 1/5 CXR  again in am  Will ask CVTS to see given frequent recurrence - CVTS reportedly aware and tracking chart. If dc'd will see Monday in office and plan for ?vats.    Brett CanalesSteve Minor ACNP [facility] Pager [phone] till 3 pm If no answer page [phone] 04/14/2016, 11:23 AM

## 2016-04-15 ENCOUNTER — Observation Stay (HOSPITAL_COMMUNITY): Payer: BLUE CROSS/BLUE SHIELD

## 2016-04-15 DIAGNOSIS — J939 Pneumothorax, unspecified: Secondary | ICD-10-CM

## 2016-04-15 NOTE — Progress Notes (Signed)
Name: [name] ChafeMarvin [name] MRN: [id number] DOB: [birth year]    ADMISSION DATE:  04/13/2016  REFERRING MD :  EDP   CHIEF COMPLAINT:  L CP Elsie Saas/sob   BRIEF PATIENT DESCRIPTION: 25yo male with hx multiple spontaneous L ptx x 2 (July 2017, December 2017) presented 1/4 with chest pain radiating to L shoulder and SOB.   CXR revealed 10% R apical ptx.  PCCM called to admit.   SIGNIFICANT EVENTS    STUDIES:  UDS 04/13/16 > POS THC   CT chest 1/4> 1. Small left apical pneumothorax measuring less than 10%. No apical bleb or bulla.   SUBJECTIVE: still significant CP L of sternum but no longer radiates to shouder NAD on "100%" fm  VITAL SIGNS: Temp:  [97.7 F (36.5 C)-98 F (36.7 C)] 98 F (36.7 C) (01/06 0700) Pulse Rate:  [72-88] 88 (01/06 0700) Resp:  [19-22] 19 (01/06 0700) BP: (122-131)/(66-81) 122/66 (01/06 0700) SpO2:  [99 %-100 %] 100 % (01/06 0700) FiO2 (%):  [50 %] 50 % (01/05 1136)    PHYSICAL EXAMINATION: General:  Very pleasant young,tall, thin male, NAD  Neuro:  Awake, alert, appropriate, MAE  HEENT:  Mm moist, no JVD    Cardiovascular:  s1s2 rrr Lungs:  resps even non labored on RA, good breath sounds bilat . Left chest with 2   chest tubes scars Abdomen:  Soft, non tender  Musculoskeletal:  Warm and dry, no edema     Recent Labs Lab 04/13/16 1032  NA 137  K 3.9  CL 103  CO2 24  BUN 13  CREATININE 1.13  GLUCOSE 89    Recent Labs Lab 04/13/16 1032  HGB 13.9  HCT 42.5  WBC 5.5  PLT 230   Ct Chest Wo Contrast  Result Date: 04/13/2016 CLINICAL DATA:  Left-sided chest pain EXAM: CT CHEST WITHOUT CONTRAST TECHNIQUE: Multidetector CT imaging of the chest was performed following the standard protocol without IV contrast. COMPARISON:  None. FINDINGS: Cardiovascular: No significant vascular findings. Normal heart size. No pericardial effusion. Mediastinum/Nodes: No enlarged mediastinal or axillary lymph nodes. Thyroid gland, trachea, and esophagus demonstrate no  significant findings. Lungs/Pleura: Lungs are clear. No pneumothorax. No focal consolidation. Small apical pneumothorax measuring less than 10%. Upper Abdomen: No acute abnormality. Musculoskeletal: No chest wall mass or suspicious bone lesions identified. IMPRESSION: 1. Small left apical pneumothorax measuring less than 10%. No apical bleb or bulla. Electronically Signed   By: Elige KoHetal  Patel   On: 04/13/2016 11:50   Dg Chest Port 1 View  Result Date: 04/15/2016 CLINICAL DATA:  Pneumothorax EXAM: PORTABLE CHEST 1 VIEW COMPARISON:  Yesterday FINDINGS: Trace left apical pneumothorax which is diminished from yesterday and admission radiography. Normal heart size and mediastinal contours. Clear lungs. No osseous findings. IMPRESSION: Trace and decreased left apical pneumothorax. Electronically Signed   By: Marnee SpringJonathon  Watts M.D.   On: 04/15/2016 08:48   Dg Chest Portable 1 View  Result Date: 04/14/2016 CLINICAL DATA:  Spontaneous pneumothorax. EXAM: PORTABLE CHEST 1 VIEW COMPARISON:  04/13/2016 FINDINGS: There is no focal parenchymal opacity. There is a small persistent left apical pneumothorax measuring less than 10% without significant interval change accounting for differences in obliquity. The heart and mediastinal contours are unremarkable. The osseous structures are unremarkable. IMPRESSION: Stable, small left apical pneumothorax measuring less than 10%. No significant interval change. Electronically Signed   By: Elige KoHetal  Patel   On: 04/14/2016 08:31   Portable Chest 1 View  Result Date: 04/13/2016 CLINICAL DATA:  Onset of  severe left-sided chest pain today. History of spontaneous pneumothorax treated with chest tube in the past. EXAM: PORTABLE CHEST 1 VIEW COMPARISON:  Chest x-ray and chest CT scan of April 13, 2016 FINDINGS: The 10% or less left apical pneumothorax is again demonstrated. Elsewhere the lungs are unremarkable. The heart and pulmonary vascularity are normal. The mediastinum is normal in width  and position. The bony thorax exhibits no acute abnormality. IMPRESSION: 10% or less left apical pneumothorax not significantly changed since the earlier study. Electronically Signed   By: David  [place] M.D.   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 This time small, <10%, pt comfortable.  -  ptx still present and still having pleurtic cp so high risk recurrent/ return if d/c now  PLAN -  Will hold off on chest tube for now Continue  high flow O2   CVTS reportedly aware and tracking chart.   Repeat pa and lat cxr 1/7 then consider d/c    Sandrea Hughs, MD Pulmonary and Critical Care Medicine [facility] Cell [phone] After 5:30 PM or weekends, call [phone]

## 2016-04-16 ENCOUNTER — Inpatient Hospital Stay (HOSPITAL_COMMUNITY): Payer: BLUE CROSS/BLUE SHIELD

## 2016-04-16 MED ORDER — IBUPROFEN 200 MG PO TABS: 200.0000 mg | ORAL_TABLET | Freq: Four times a day (QID) | ORAL | 0 refills | Status: DC | PRN

## 2016-04-16 NOTE — Discharge Instructions (Signed)
No heavy lifting Our office will call you to arrange follow up with surgeon and our MD's. Call [phone]([place] [facility]) I you have questions. No smoking(of anything)!!!

## 2016-04-16 NOTE — Progress Notes (Signed)
   Name: [name] ChafeMarvin Malkowski MRN: [id number] DOB: 1990-08-19    ADMISSION DATE:  04/13/2016  REFERRING MD :  EDP   CHIEF COMPLAINT:  L CP Elsie Saas/sob   BRIEF PATIENT DESCRIPTION: 25yo male with hx multiple spontaneous L ptx x 2 (July 2017, December 2017) presented 1/4 with chest pain radiating to L shoulder and SOB.   CXR revealed 10% R apical ptx.  PCCM called to admit.   SIGNIFICANT EVENTS    STUDIES:  UDS 04/13/16 > POS THC   CT chest 1/4> 1. Small left apical pneumothorax measuring less than 10%. No apical bleb or bulla.   SUBJECTIVE:  Less cp  VITAL SIGNS: Temp:  [97.7 F (36.5 C)-98.3 F (36.8 C)] 97.7 F (36.5 C) (01/07 1222) Pulse Rate:  [50-61] 50 (01/07 1222) Resp:  [17-18] 17 (01/07 1222) BP: (119-133)/(64-71) 121/64 (01/07 1222) SpO2:  [100 %] 100 % (01/07 1222) FiO2 (%):  [50 %] 50 % (01/06 2100) Still wearing FM ? fio2    PHYSICAL EXAMINATION: General:  Very pleasant young,tall, thin male, NAD  Neuro:  Awake, alert, appropriate, MAE  HEENT:  Mm moist, no JVD    Cardiovascular:  s1s2 rrr Lungs:  resps even non labored on RA, good breath sounds bilat . Left chest with 2   chest tubes scars Abdomen:  Soft, non tender  Musculoskeletal:  Warm and dry, no edema     Recent Labs Lab 04/13/16 1032  NA 137  K 3.9  CL 103  CO2 24  BUN 13  CREATININE 1.13  GLUCOSE 89    Recent Labs Lab 04/13/16 1032  HGB 13.9  HCT 42.5  WBC 5.5  PLT 230   Dg Chest 2 View  Result Date: 04/16/2016 CLINICAL DATA:  Left chest pain. History of recurrent left pneumothorax. EXAM: CHEST  2 VIEW COMPARISON:  Single-view of the chest 04/15/2004 FINDINGS: No pneumothorax is identified. Both lungs are expanded and clear. Heart size is normal. No bony abnormality. IMPRESSION: Negative for pneumothorax.  No acute disease. Electronically Signed   By: Drusilla Kannerhomas  Dalessio M.D.   On: 04/16/2016 10:32   Dg Chest Port 1 View  Result Date: 04/15/2016 CLINICAL DATA:  Pneumothorax EXAM: PORTABLE  CHEST 1 VIEW COMPARISON:  Yesterday FINDINGS: Trace left apical pneumothorax which is diminished from yesterday and admission radiography. Normal heart size and mediastinal contours. Clear lungs. No osseous findings. IMPRESSION: Trace and decreased left apical pneumothorax. Electronically Signed   By: Marnee SpringJonathon  Watts M.D.   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 This time small, <10%, pt comfortable.  -  ptx resolved, min residual pain not even taking nsaids  PLAN - D/c home Prn advil up to 800 mg tid with meals F/u T surgery  Advised against inhaling anything but clean air (he denies THC x 01/2016 and yet UDS pos)     Sandrea HughsMichael Shenekia Riess, MD Pulmonary and Critical Care Medicine [facility] Cell [phone] After 5:30 PM or weekends, call [phone]

## 2016-04-16 NOTE — Progress Notes (Signed)
Pt ready for DC.  DC instructions reviewed and copy given.   Follow up appointments given and explained.

## 2016-04-16 NOTE — Discharge Summary (Signed)
Physician Discharge Summary  Patient ID: [name] ChafeMarvin Pereda MRN: [id number] DOB/AGE: November 21, 1990 26 y.o.  Admit date: 04/13/2016 Discharge date: 04/16/2016  Problem List Active Problems:   Spontaneous pneumothorax   Pneumothorax on left  HPI: 26yo male with hx multiple spontaneous L ptx x 2 (July 2017, December 2017) presented 1/4 with chest pain radiating to L shoulder and SOB.   CXR revealed 10% R apical ptx.  PCCM called to admit.  Pt admitted in July with same.  Treated with pigtail cath and resolved. Readmitted in [place] 2 weeks ago, pigtail placed.  Took 2 weeks off work. Went back to work today and had L chest pain radiation to shoulder and "knew" it was the same.  Denies dyspnea, hemoptysis.  No heavy lifting prior.  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   Hospital Course:  VITAL SIGNS: Temp:  [97.7 F (36.5 C)-98.3 F (36.8 C)] 97.7 F (36.5 C) (01/07 1222) Pulse Rate:  [50-61] 50 (01/07 1222) Resp:  [17-18] 17 (01/07 1222) BP: (119-133)/(64-71) 121/64 (01/07 1222) SpO2:  [100 %] 100 % (01/07 1222) FiO2 (%):  [50 %] 50 % (01/06 2100)     PHYSICAL EXAMINATION: General:  Very pleasant young,tall, thin male, NAD  Neuro:  Awake, alert, appropriate, MAE  HEENT:  Mm moist, no JVD    Cardiovascular:  s1s2 rrr Lungs:  resps even non labored on RA, good breath sounds bilat . 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 This time small, <10%, pt comfortable.  -  ptx resolved, min residual pain not even taking nsaids  PLAN - D/c home Prn advil as instructed F/u T surgery  Advised against inhaling anything but clean air (he denies THC x 01/2016 and yet UDS pos) Labs at discharge Lab Results  Component Value Date   CREATININE 1.13 04/13/2016   BUN 13 04/13/2016   NA 137 04/13/2016   K 3.9 04/13/2016   CL 103 04/13/2016   CO2  24 04/13/2016   Lab Results  Component Value Date   WBC 5.5 04/13/2016   HGB 13.9 04/13/2016   HCT 42.5 04/13/2016   MCV 71.8 (L) 04/13/2016   PLT 230 04/13/2016   Lab Results  Component Value Date   ALT 24 04/13/2016   AST 30 04/13/2016   ALKPHOS 50 04/13/2016   BILITOT 0.8 04/13/2016   Lab Results  Component Value Date   INR 1.06 04/13/2016    Current radiology studies Dg Chest 2 View  Result Date: 04/16/2016 CLINICAL DATA:  Left chest pain. History of recurrent left pneumothorax. EXAM: CHEST  2 VIEW COMPARISON:  Single-view of the chest 04/15/2004 FINDINGS: No pneumothorax is identified. Both lungs are expanded and clear. Heart size is normal. No bony abnormality. IMPRESSION: Negative for pneumothorax.  No acute disease. Electronically Signed   By: Drusilla Kannerhomas  Dalessio M.D.   On: 04/16/2016 10:32   Dg Chest Port 1 View  Result Date: 04/15/2016 CLINICAL DATA:  Pneumothorax EXAM: PORTABLE CHEST 1 VIEW COMPARISON:  Yesterday FINDINGS: Trace left apical pneumothorax which is diminished from yesterday and admission radiography. Normal heart size and mediastinal contours. Clear lungs. No osseous findings. IMPRESSION: Trace and decreased left apical pneumothorax. Electronically Signed   By: Marnee SpringJonathon  Watts M.D.   On: 04/15/2016 08:48    Disposition:  01-Home or Self Care  Discharge  Instructions    Increase activity slowly    Complete by:  As directed      Allergies as of 04/16/2016   No Known Allergies     Medication List    TAKE these medications   ibuprofen 200 MG tablet Commonly known as:  ADVIL Take 1 tablet (200 mg total) by mouth every 6 (six) hours as needed.          Discharged Condition: Good  Time spent on discharge greater than 40 minutes.  Vital signs at Discharge. Temp:  [97.7 F (36.5 C)-98.3 F (36.8 C)] 97.7 F (36.5 C) (01/07 1222) Pulse Rate:  [50-61] 50 (01/07 1222) Resp:  [17-18] 17 (01/07 1222) BP: (119-133)/(64-71) 121/64 (01/07  1222) SpO2:  [100 %] 100 % (01/07 1222) FiO2 (%):  [50 %] 50 % (01/06 2100) Office follow up Special Information or instructions. Follow up with Dr. Tyrone Sage as instructed Signed: Brett Canales Minor ACNP [facility] Pager [phone] till 3 pm If no answer page [phone] 04/16/2016, 3:37 PM   Pt seen and examined and xrays reviewed on day of discharge and I actively participated in discharge planning  Sandrea Hughs, MD Pulmonary and Critical Care Medicine [facility] Cell [phone] After 5:30 PM or weekends, use [facility] [phone]

## 2016-04-17 ENCOUNTER — Encounter (HOSPITAL_COMMUNITY): Payer: Self-pay | Admitting: *Deleted

## 2016-04-17 ENCOUNTER — Other Ambulatory Visit: Payer: Self-pay | Admitting: Internal Medicine

## 2016-04-17 ENCOUNTER — Other Ambulatory Visit: Payer: Self-pay | Admitting: *Deleted

## 2016-04-17 ENCOUNTER — Ambulatory Visit
Admission: RE | Admit: 2016-04-17 | Discharge: 2016-04-17 | Disposition: A | Discharge status: Home / Self Care | Payer: BLUE CROSS/BLUE SHIELD | Source: Ambulatory Visit | Attending: Cardiothoracic Surgery | Admitting: Cardiothoracic Surgery

## 2016-04-17 ENCOUNTER — Encounter: Payer: Self-pay | Admitting: Cardiothoracic Surgery

## 2016-04-17 ENCOUNTER — Institutional Professional Consult (permissible substitution) (INDEPENDENT_AMBULATORY_CARE_PROVIDER_SITE_OTHER): Payer: BLUE CROSS/BLUE SHIELD | Admitting: Cardiothoracic Surgery

## 2016-04-17 ENCOUNTER — Other Ambulatory Visit: Payer: Self-pay | Admitting: Cardiothoracic Surgery

## 2016-04-17 VITALS — BP 137/81 | HR 73 | Resp 16 | Ht 72.0 in | Wt 134.0 lb

## 2016-04-17 DIAGNOSIS — J9383 Other pneumothorax: Secondary | ICD-10-CM

## 2016-04-17 DIAGNOSIS — J939 Pneumothorax, unspecified: Secondary | ICD-10-CM

## 2016-04-17 LAB — ALPHA-1 ANTITRYPSIN PHENOTYPE: A-1 Antitrypsin, Ser: 98 mg/dL (ref 90–200)

## 2016-04-17 NOTE — Progress Notes (Signed)
[address]       [place] [id number]             [phone]                    [name] [name] [facility] Medical Record #[id number] Date of Birth: [birth year]  Referring: Nyoka CowdenWert, Michael B, MD Primary Care: No PCP Per Patient  Chief Complaint:    Chief Complaint  Patient presents with  . Spontaneous Pneumothorax    x 3...eval for surgery.Marland Kitchen.Marland Kitchen.CT CHEST 04/13/16 and CXR today    History of Present Illness:    [name] [name] 26 y.o. male is seen in the office  today for recurrent left pneumothorax. The patient notes initial episode of a left pneumothorax in late July 2017. He was admitted to [facility] and a pigtail catheter was placed into the left chest, he was never seen by thoracic surgery. In late December and he had recurrent symptoms of left chest pain and shortness of breath and was seen in [place]. He was admitted there and a second pigtail catheter is placed into the left chest. He was then discharged home. January 4 he had some left chest discomfort was [facility] by [facility] service. No chest tube was placed. Patient understanding was that he was kept in the hospital until yesterday to flush out the nitrogen from his system. Dr. Marchelle Gearingamaswamy had called the office last week and arrange for him to be seen today to consider left VATS for recurrent pneumothoraces.   The patient occasionally smokes, he's had no known history of underlying pulmonary disease, he denies childhood asthma.      Current Activity/ Functional Status:  Patient is independent with mobility/ambulation, transfers, ADL's, IADL's.   Zubrod Score: At the time of surgery this patient's most appropriate activity status/level should be described as: [x]     0    Normal activity, no symptoms []     1    Restricted in physical strenuous activity but ambulatory, able to do out light work []     2    Ambulatory and capable of self care, unable to do work activities, up and about                >50 % of waking hours                              []     3    Only limited self care, in bed greater than 50% of waking hours []     4    Completely disabled, no self care, confined to bed or chair []     5    Moribund   Past Medical History:  Diagnosis Date  . Bradycardia, sinus    States he's been told it's an athletic heart rate  . Spontaneous pneumothorax 04/13/2016   previous pneumothorax  in 10/2015    Past Surgical History:  Procedure Laterality Date  . CHEST TUBE INSERTION Left 10/2015   also on 04/01/16    Family History: Patient denies any family history of pneumothorax Both his mother and father alive and well  Social History   Social History  . Marital status: Unknown    Spouse name: N/A  . Number of children: N/A  . Years of education: N/A   Occupational History  . 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 Smoking status: Former Smoker    Types: Cigarettes    Quit date: 01/16/2016  . Smokeless tobacco: Never Used     Comment: ONLY SMOKED EVERY NOW AND THEN  . Alcohol use No     Comment: occasional  . Drug use: No  . Sexual activity: Not on file   Other Topics Concern  . Not on file   Social History Narrative  . No narrative on file    History  Smoking Status  . Former Smoker  . Types: Cigarettes  . Quit date: 01/16/2016  Smokeless Tobacco  . Never Used    Comment: ONLY SMOKED EVERY NOW AND THEN    History  Alcohol Use No    Comment: occasional     Allergies  Allergen Reactions  . No Known Allergies     Current Outpatient Prescriptions  Medication Sig Dispense Refill  . ibuprofen (ADVIL) 200 MG tablet Take 1 tablet (200 mg total) by mouth every 6 (six) hours as needed. 30 tablet 0   No current facility-administered medications for this visit.       Review of Systems:     Cardiac Review of Systems: Y or N  Chest Pain [  y   ]  Resting SOB [ n  ] Exertional SOB  [name]  ]  Orthopnea [name]  ]   Pedal Edema [ n  ]    Palpitations [name]  ] Syncope  [ n ]   Presyncope [   ]  General Review of Systems: [Y] = yes [  ]=no Constitional: recent weight change [  ];  Wt loss over the last 3 months [   ] anorexia [  ]; fatigue [  ]; nausea [  ]; night sweats [  ]; fever [ n ]; or chills [  ];          Dental: poor dentition[  ]; Last Dentist visit:   Eye : blurred vision [  ]; diplopia [   ]; vision changes [  ];  Amaurosis fugax[  ]; Resp: cough [ y ];  wheezing[ n ];  hemoptysis[n  ]; shortness of breath[  ]; paroxysmal nocturnal dyspnea[  ]; dyspnea on exertion[  ]; or orthopnea[  ];  GI:  gallstones[  ], vomiting[  ];  dysphagia[  ]; melena[  ];  hematochezia [  ]; heartburn[  ];   Hx of  Colonoscopy[  ]; GU: kidney stones [  ]; hematuria[  ];   dysuria [  ];  nocturia[  ];  history of     obstruction [  ]; urinary frequency [  ]             Skin: rash, swelling[  ];, hair loss[  ];  peripheral edema[  ];  or itching[  ]; Musculosketetal: myalgias[  ];  joint swelling[  ];  joint erythema[  ];  joint pain[  ];  back pain[  ];  Heme/Lymph: bruising[  ];  bleeding[  ];  anemia[  ];  Neuro: TIA[ n ];  headaches[  ];  stroke[  ];  vertigo[  ];  seizures[  ];   paresthesias[  ];  difficulty walking[  ];  Psych:depression[n  ]; anxiety[n  ];  Endocrine: diabetes[  ];  thyroid dysfunction[  ];  Immunizations: Flu up to date [ y ]; Pneumococcal up to  date [ n ];  Other:  Physical Exam: BP 137/81 (BP Location: Left Arm, Patient Position: Sitting, Cuff Size: Normal)   Pulse 73   Resp 16   Ht 6' (1.829 m)   Wt 134 lb (60.8 kg)   SpO2 95% Comment: ON RA  BMI 18.17 kg/m   PHYSICAL EXAMINATION: General appearance: alert, cooperative and no distress Head: Normocephalic, without obvious abnormality, atraumatic Neck: no adenopathy, no carotid bruit, no JVD, supple, symmetrical, trachea midline and thyroid not enlarged, symmetric, no  tenderness/mass/nodules Lymph nodes: Cervical, supraclavicular, and axillary nodes normal. Resp: clear to auscultation bilaterally Back: symmetric, no curvature. ROM normal. No CVA tenderness. Cardio: regular rate and rhythm, S1, S2 normal, no murmur, click, rub or gallop GI: soft, non-tender; bowel sounds normal; no masses,  no organomegaly Extremities: extremities normal, atraumatic, no cyanosis or edema Neurologic: Grossly normal  Diagnostic Studies & Laboratory data:     Recent Radiology Findings:   Dg Chest 2 View  Result Date: 04/17/2016 CLINICAL DATA:  Spontaneous pneumothorax. EXAM: CHEST  2 VIEW COMPARISON:  04/16/2016. FINDINGS: No acute cardiopulmonary disease. Heart size normal. No pleural effusion or pneumothorax. No acute bony abnormality . IMPRESSION: No acute cardiopulmonary disease. Electronically Signed   By: Maisie Fus  Register   On: 04/17/2016 14:57   Dg Chest 2 View  Result Date: 04/16/2016 CLINICAL DATA:  Left chest pain. History of recurrent left pneumothorax. EXAM: CHEST  2 VIEW COMPARISON:  Single-view of the chest 04/15/2004 FINDINGS: No pneumothorax is identified. Both lungs are expanded and clear. Heart size is normal. No bony abnormality. IMPRESSION: Negative for pneumothorax.  No acute disease. Electronically Signed   By: Drusilla Kanner M.D.   On: 04/16/2016 10:32   Dg Chest 2 View  Result Date: 04/13/2016 CLINICAL DATA:  Left-sided chest pain EXAM: CHEST  2 VIEW COMPARISON:  11/04/2015 FINDINGS: There is a small left apical pneumothorax measuring less than 10%. There is no focal parenchymal opacity. There is no pleural effusion. There is no right pneumothorax. The heart and mediastinal contours are unremarkable. The osseous structures are unremarkable. IMPRESSION: Small left apical pneumothorax measuring less than 10%. Critical Value/emergent results were called by telephone at the time of interpretation on 04/13/2016 at 10:02 am to Dr. Marily Memos , who verbally  acknowledged these results. Electronically Signed   By: Elige Ko   On: 04/13/2016 10:10   Ct Chest Wo Contrast  Result Date: 04/13/2016 CLINICAL DATA:  Left-sided chest pain EXAM: CT CHEST WITHOUT CONTRAST TECHNIQUE: Multidetector CT imaging of the chest was performed following the standard protocol without IV contrast. COMPARISON:  None. FINDINGS: Cardiovascular: No significant vascular findings. Normal heart size. No pericardial effusion. Mediastinum/Nodes: No enlarged mediastinal or axillary lymph nodes. Thyroid gland, trachea, and esophagus demonstrate no significant findings. Lungs/Pleura: Lungs are clear. No pneumothorax. No focal consolidation. Small apical pneumothorax measuring less than 10%. Upper Abdomen: No acute abnormality. Musculoskeletal: No chest wall mass or suspicious bone lesions identified. IMPRESSION: 1. Small left apical pneumothorax measuring less than 10%. No apical bleb or bulla. Electronically Signed   By: Elige Ko   On: 04/13/2016 11:50   CLINICAL DATA:  Left-sided chest pain  EXAM: CT CHEST WITHOUT CONTRAST  TECHNIQUE: Multidetector CT imaging of the chest was performed following the standard protocol without IV contrast.  COMPARISON:  None.  FINDINGS: Cardiovascular: No significant vascular findings. Normal heart size. No pericardial effusion.  Mediastinum/Nodes: No enlarged mediastinal or axillary lymph nodes. Thyroid gland, trachea, and esophagus demonstrate no significant  findings.  Lungs/Pleura: Lungs are clear. No pneumothorax. No focal consolidation. Small apical pneumothorax measuring less than 10%.  Upper Abdomen: No acute abnormality.  Musculoskeletal: No chest wall mass or suspicious bone lesions identified.  IMPRESSION: 1. Small left apical pneumothorax measuring less than 10%. No apical bleb or bulla.   Electronically Signed   By: Elige Ko   On: 04/13/2016 11:50  I have independently reviewed the above  radiology studies  and reviewed the findings with the patient.      Recent Lab Findings: Lab Results  Component Value Date   WBC 5.5 04/13/2016   HGB 13.9 04/13/2016   HCT 42.5 04/13/2016   PLT 230 04/13/2016   GLUCOSE 89 04/13/2016   ALT 24 04/13/2016   AST 30 04/13/2016   NA 137 04/13/2016   K 3.9 04/13/2016   CL 103 04/13/2016   CREATININE 1.13 04/13/2016   BUN 13 04/13/2016   CO2 24 04/13/2016   INR 1.06 04/13/2016      Assessment / Plan:   With the patient's recurrent left pneumothorax at least twice if not 3 times in the past 6 months recommended to him that we proceed with bronchoscopy left video-assisted thoracoscopy for stapling of blebs and pleurodesis. Risks and options and expectations  of surgery are reviewed with him in detail. He is will to pproceed tomorrow.      I  spent 30 minutes counseling the patient face to face and 50% or more the  time was spent in counseling and coordination of care. The total time spent in the appointment was 40 minutes.  [name] MD      [address] [place] [place] [address] Office [phone]   Beeper [phone]  04/17/2016 3:32 PM

## 2016-04-17 NOTE — Progress Notes (Signed)
Spoke with pt for pre-op call. Pt denies cardiac history. Just discharged from hospital yesterday after have another spontaneous pneumothorax.

## 2016-04-17 NOTE — Anesthesia Preprocedure Evaluation (Addendum)
Anesthesia Evaluation  Patient identified by MRN, date of birth, ID band Patient awake    Reviewed: Allergy & Precautions, H&P , NPO status , Patient's Chart, lab work & pertinent test results  Airway Mallampati: I  TM Distance: >3 FB Neck ROM: Full    Dental no notable dental hx. (+) Teeth Intact, Dental Advisory Given   Pulmonary neg pulmonary ROS, former smoker,    Pulmonary exam normal breath sounds clear to auscultation       Cardiovascular Exercise Tolerance: Good negative cardio ROS   Rhythm:Regular Rate:Tachycardia     Neuro/Psych negative neurological ROS  negative psych ROS   GI/Hepatic negative GI ROS, Neg liver ROS,   Endo/Other  negative endocrine ROS  Renal/GU negative Renal ROS  negative genitourinary   Musculoskeletal   Abdominal   Peds  Hematology negative hematology ROS (+)   Anesthesia Other Findings   Reproductive/Obstetrics negative OB ROS                            Anesthesia Physical Anesthesia Plan  ASA: II  Anesthesia Plan: General   Post-op Pain Management:    Induction: Intravenous  Airway Management Planned: Double Lumen EBT  Additional Equipment: Arterial line  Intra-op Plan:   Post-operative Plan: Extubation in OR  Informed Consent: I have reviewed the patients History and Physical, chart, labs and discussed the procedure including the risks, benefits and alternatives for the proposed anesthesia with the patient or authorized representative who has indicated his/her understanding and acceptance.   Dental advisory given  Plan Discussed with: CRNA, Anesthesiologist and Surgeon  Anesthesia Plan Comments:        Anesthesia Quick Evaluation

## 2016-04-18 ENCOUNTER — Inpatient Hospital Stay (HOSPITAL_COMMUNITY): Payer: BLUE CROSS/BLUE SHIELD

## 2016-04-18 ENCOUNTER — Encounter (HOSPITAL_COMMUNITY)
Admission: RE | Disposition: A | Discharge status: Home / Self Care | Payer: Self-pay | Source: Ambulatory Visit | Attending: Cardiothoracic Surgery

## 2016-04-18 ENCOUNTER — Inpatient Hospital Stay (HOSPITAL_COMMUNITY): Payer: BLUE CROSS/BLUE SHIELD | Admitting: Certified Registered Nurse Anesthetist

## 2016-04-18 ENCOUNTER — Encounter (HOSPITAL_COMMUNITY): Payer: Self-pay | Admitting: *Deleted

## 2016-04-18 ENCOUNTER — Inpatient Hospital Stay (HOSPITAL_COMMUNITY)
Admission: RE | Admit: 2016-04-18 | Discharge: 2016-04-21 | DRG: 165 | Disposition: A | Discharge status: Home / Self Care | Payer: BLUE CROSS/BLUE SHIELD | Source: Ambulatory Visit | Attending: Cardiothoracic Surgery | Admitting: Cardiothoracic Surgery

## 2016-04-18 DIAGNOSIS — J439 Emphysema, unspecified: Secondary | ICD-10-CM | POA: Diagnosis present

## 2016-04-18 DIAGNOSIS — F17210 Nicotine dependence, cigarettes, uncomplicated: Secondary | ICD-10-CM | POA: Diagnosis present

## 2016-04-18 DIAGNOSIS — J9383 Other pneumothorax: Secondary | ICD-10-CM | POA: Diagnosis present

## 2016-04-18 DIAGNOSIS — Z9689 Presence of other specified functional implants: Secondary | ICD-10-CM

## 2016-04-18 DIAGNOSIS — I9589 Other hypotension: Secondary | ICD-10-CM | POA: Diagnosis not present

## 2016-04-18 DIAGNOSIS — J939 Pneumothorax, unspecified: Secondary | ICD-10-CM

## 2016-04-18 HISTORY — PX: VIDEO BRONCHOSCOPY: SHX5072

## 2016-04-18 HISTORY — PX: PLEURADESIS: SHX6030

## 2016-04-18 HISTORY — PX: VIDEO ASSISTED THORACOSCOPY: SHX5073

## 2016-04-18 HISTORY — PX: STAPLING OF BLEBS: SHX6429

## 2016-04-18 HISTORY — DX: Bradycardia, unspecified: R00.1

## 2016-04-18 LAB — COMPREHENSIVE METABOLIC PANEL
ALT: 30 U/L (ref 17–63)
AST: 39 U/L (ref 15–41)
Albumin: 4.6 g/dL (ref 3.5–5.0)
Alkaline Phosphatase: 47 U/L (ref 38–126)
Anion gap: 12 (ref 5–15)
BUN: 22 mg/dL — ABNORMAL HIGH (ref 6–20)
CO2: 22 mmol/L (ref 22–32)
Calcium: 10 mg/dL (ref 8.9–10.3)
Chloride: 104 mmol/L (ref 101–111)
Creatinine, Ser: 1.11 mg/dL (ref 0.61–1.24)
GFR calc Af Amer: >60 mL/min (ref >60)
GFR calc non Af Amer: >60 mL/min (ref >60)
Glucose, Bld: 99 mg/dL (ref 65–99)
Potassium: 3.7 mmol/L (ref 3.5–5.1)
Sodium: 138 mmol/L (ref 135–145)
Total Bilirubin: 0.7 mg/dL (ref 0.3–1.2)
Total Protein: 7.3 g/dL (ref 6.5–8.1)

## 2016-04-18 LAB — BLOOD GAS, ARTERIAL
Acid-Base Excess: 2.8 mmol/L — ABNORMAL HIGH (ref 0.0–2.0)
Bicarbonate: 27.2 mmol/L (ref 20.0–28.0)
Collection site: 449841
FIO2: 21
O2 Saturation: 96.8 %
Patient temperature: 98.6
pCO2 arterial: 45.1 mmHg (ref 32.0–48.0)
pH, Arterial: 7.397 (ref 7.350–7.450)
pO2, Arterial: 92.1 mmHg (ref 83.0–108.0)

## 2016-04-18 LAB — APTT: aPTT: 30 seconds (ref 24–36)

## 2016-04-18 LAB — URINALYSIS, ROUTINE W REFLEX MICROSCOPIC
Bilirubin Urine: NEGATIVE
Glucose, UA: NEGATIVE mg/dL
Hgb urine dipstick: NEGATIVE
Ketones, ur: NEGATIVE mg/dL
Leukocytes, UA: NEGATIVE
Nitrite: NEGATIVE
Protein, ur: NEGATIVE mg/dL
Specific Gravity, Urine: 1.023 (ref 1.005–1.030)
pH: 5 (ref 5.0–8.0)

## 2016-04-18 LAB — CBC
HCT: 38.9 % — ABNORMAL LOW (ref 39.0–52.0)
Hemoglobin: 13 g/dL (ref 13.0–17.0)
MCH: 23.4 pg — ABNORMAL LOW (ref 26.0–34.0)
MCHC: 33.4 g/dL (ref 30.0–36.0)
MCV: 70.1 fL — ABNORMAL LOW (ref 78.0–100.0)
Platelets: 248 10*3/uL (ref 150–400)
RBC: 5.55 MIL/uL (ref 4.22–5.81)
RDW: 15.9 % — ABNORMAL HIGH (ref 11.5–15.5)
WBC: 8.9 10*3/uL (ref 4.0–10.5)

## 2016-04-18 LAB — GLUCOSE, CAPILLARY
Glucose-Capillary: 115 mg/dL — ABNORMAL HIGH (ref 65–99)
Glucose-Capillary: 122 mg/dL — ABNORMAL HIGH (ref 65–99)

## 2016-04-18 LAB — PROTIME-INR
INR: 1.11
Prothrombin Time: 14.3 seconds (ref 11.4–15.2)

## 2016-04-18 LAB — TYPE AND SCREEN
ABO/RH(D): O POS
Antibody Screen: NEGATIVE

## 2016-04-18 LAB — ABO/RH: ABO/RH(D): O POS

## 2016-04-18 SURGERY — VIDEO ASSISTED THORACOSCOPY
Anesthesia: General | Site: Chest

## 2016-04-18 MED ORDER — DEXTROSE-NACL 5-0.45 % IV SOLN
INTRAVENOUS | Status: DC
  Administered 2016-04-18: 100 mL/h via INTRAVENOUS

## 2016-04-18 MED ORDER — FENTANYL CITRATE (PF) 250 MCG/5ML IJ SOLN
INTRAMUSCULAR | Status: AC
  Filled 2016-04-18: qty 5

## 2016-04-18 MED ORDER — FENTANYL 40 MCG/ML IV SOLN
INTRAVENOUS | Status: DC
  Administered 2016-04-18: 15 mL via INTRAVENOUS
  Administered 2016-04-18: 120 ug via INTRAVENOUS
  Administered 2016-04-19: 90 ug via INTRAVENOUS
  Administered 2016-04-19: 75 ug via INTRAVENOUS
  Administered 2016-04-19: 45 ug via INTRAVENOUS
  Administered 2016-04-19: 90 ug via INTRAVENOUS
  Administered 2016-04-19 – 2016-04-20 (×3): 30 ug via INTRAVENOUS
  Administered 2016-04-20: 90 ug via INTRAVENOUS

## 2016-04-18 MED ORDER — SUGAMMADEX SODIUM 200 MG/2ML IV SOLN
INTRAVENOUS | Status: DC | PRN
  Administered 2016-04-18: 200 mg via INTRAVENOUS

## 2016-04-18 MED ORDER — LEVALBUTEROL HCL 0.63 MG/3ML IN NEBU
0.6300 mg | INHALATION_SOLUTION | Freq: Four times a day (QID) | RESPIRATORY_TRACT | Status: DC
  Administered 2016-04-18: 0.63 mg via RESPIRATORY_TRACT
  Filled 2016-04-18: qty 3

## 2016-04-18 MED ORDER — SENNOSIDES-DOCUSATE SODIUM 8.6-50 MG PO TABS
1.0000 | ORAL_TABLET | Freq: Every day | ORAL | Status: DC
  Administered 2016-04-18 – 2016-04-20 (×3): 1 via ORAL
  Filled 2016-04-18 (×3): qty 1

## 2016-04-18 MED ORDER — INSULIN ASPART 100 UNIT/ML ~~LOC~~ SOLN
0.0000 [IU] | SUBCUTANEOUS | Status: DC
  Administered 2016-04-19: 2 [IU] via SUBCUTANEOUS

## 2016-04-18 MED ORDER — EPHEDRINE SULFATE 50 MG/ML IJ SOLN
INTRAMUSCULAR | Status: DC | PRN
  Administered 2016-04-18: 15 mg via INTRAVENOUS

## 2016-04-18 MED ORDER — OXYCODONE HCL 5 MG PO TABS
5.0000 mg | ORAL_TABLET | ORAL | Status: DC | PRN
  Administered 2016-04-20: 10 mg via ORAL
  Filled 2016-04-18: qty 2

## 2016-04-18 MED ORDER — DIPHENHYDRAMINE HCL 50 MG/ML IJ SOLN: 12.5000 mg | Freq: Four times a day (QID) | INTRAMUSCULAR | Status: DC | PRN

## 2016-04-18 MED ORDER — SODIUM CHLORIDE 0.9% FLUSH: 9.0000 mL | INTRAVENOUS | Status: DC | PRN

## 2016-04-18 MED ORDER — LACTATED RINGERS IV SOLN
INTRAVENOUS | Status: DC | PRN
  Administered 2016-04-18: 07:00:00 via INTRAVENOUS

## 2016-04-18 MED ORDER — PROPOFOL 10 MG/ML IV BOLUS
INTRAVENOUS | Status: AC
  Filled 2016-04-18: qty 20

## 2016-04-18 MED ORDER — LIDOCAINE HCL (CARDIAC) 20 MG/ML IV SOLN
INTRAVENOUS | Status: DC | PRN
  Administered 2016-04-18: 60 mg via INTRAVENOUS

## 2016-04-18 MED ORDER — TRAMADOL HCL 50 MG PO TABS: 50.0000 mg | ORAL_TABLET | Freq: Four times a day (QID) | ORAL | Status: DC | PRN

## 2016-04-18 MED ORDER — PROPOFOL 10 MG/ML IV BOLUS
INTRAVENOUS | Status: DC | PRN
Start: 2016-04-18 — End: 2016-04-18
  Administered 2016-04-18: 120 mg via INTRAVENOUS

## 2016-04-18 MED ORDER — HYDROMORPHONE HCL 1 MG/ML IJ SOLN
INTRAMUSCULAR | Status: AC
  Filled 2016-04-18: qty 0.5

## 2016-04-18 MED ORDER — DIPHENHYDRAMINE HCL 12.5 MG/5ML PO ELIX: 12.5000 mg | ORAL_SOLUTION | Freq: Four times a day (QID) | ORAL | Status: DC | PRN

## 2016-04-18 MED ORDER — ONDANSETRON HCL 4 MG/2ML IJ SOLN
INTRAMUSCULAR | Status: AC
  Filled 2016-04-18: qty 2

## 2016-04-18 MED ORDER — BISACODYL 5 MG PO TBEC
10.0000 mg | DELAYED_RELEASE_TABLET | Freq: Every day | ORAL | Status: DC
  Administered 2016-04-20 – 2016-04-21 (×2): 10 mg via ORAL
  Filled 2016-04-18 (×2): qty 2

## 2016-04-18 MED ORDER — PHENYLEPHRINE HCL 10 MG/ML IJ SOLN
INTRAMUSCULAR | Status: DC | PRN
  Administered 2016-04-18: 100 ug/min via INTRAVENOUS

## 2016-04-18 MED ORDER — HYDROMORPHONE HCL 1 MG/ML IJ SOLN
0.2500 mg | INTRAMUSCULAR | Status: DC | PRN
  Administered 2016-04-18 (×2): 0.5 mg via INTRAVENOUS

## 2016-04-18 MED ORDER — DEXTROSE 5 % IV SOLN
1.5000 g | Freq: Two times a day (BID) | INTRAVENOUS | Status: AC
  Administered 2016-04-18 – 2016-04-19 (×2): 1.5 g via INTRAVENOUS
  Filled 2016-04-18 (×2): qty 1.5

## 2016-04-18 MED ORDER — PHENYLEPHRINE 40 MCG/ML (10ML) SYRINGE FOR IV PUSH (FOR BLOOD PRESSURE SUPPORT)
PREFILLED_SYRINGE | INTRAVENOUS | Status: DC | PRN
  Administered 2016-04-18 (×2): 80 ug via INTRAVENOUS
  Administered 2016-04-18 (×2): 120 ug via INTRAVENOUS

## 2016-04-18 MED ORDER — EPHEDRINE 5 MG/ML INJ
INTRAVENOUS | Status: AC
  Filled 2016-04-18: qty 10

## 2016-04-18 MED ORDER — SUCCINYLCHOLINE CHLORIDE 200 MG/10ML IV SOSY
PREFILLED_SYRINGE | INTRAVENOUS | Status: AC
  Filled 2016-04-18: qty 10

## 2016-04-18 MED ORDER — 0.9 % SODIUM CHLORIDE (POUR BTL) OPTIME
TOPICAL | Status: DC | PRN
  Administered 2016-04-18: 2000 mL

## 2016-04-18 MED ORDER — FENTANYL 40 MCG/ML IV SOLN
INTRAVENOUS | Status: AC
  Filled 2016-04-18: qty 25

## 2016-04-18 MED ORDER — SODIUM CHLORIDE 0.9 % IV SOLN
30.0000 meq | Freq: Every day | INTRAVENOUS | Status: DC | PRN
  Filled 2016-04-18: qty 15

## 2016-04-18 MED ORDER — ACETAMINOPHEN 160 MG/5ML PO SOLN: 1000.0000 mg | Freq: Four times a day (QID) | ORAL | Status: DC

## 2016-04-18 MED ORDER — MIDAZOLAM HCL 5 MG/5ML IJ SOLN
INTRAMUSCULAR | Status: DC | PRN
Start: 2016-04-18 — End: 2016-04-18
  Administered 2016-04-18: 2 mg via INTRAVENOUS

## 2016-04-18 MED ORDER — ACETAMINOPHEN 500 MG PO TABS
1000.0000 mg | ORAL_TABLET | Freq: Four times a day (QID) | ORAL | Status: DC
  Administered 2016-04-18 – 2016-04-21 (×5): 1000 mg via ORAL
  Filled 2016-04-18 (×6): qty 2

## 2016-04-18 MED ORDER — DEXTROSE 5 % IV SOLN
1.5000 g | INTRAVENOUS | Status: AC
  Administered 2016-04-18: 1.5 g via INTRAVENOUS
  Filled 2016-04-18: qty 1.5

## 2016-04-18 MED ORDER — ROCURONIUM BROMIDE 100 MG/10ML IV SOLN
INTRAVENOUS | Status: DC | PRN
  Administered 2016-04-18: 50 mg via INTRAVENOUS

## 2016-04-18 MED ORDER — MIDAZOLAM HCL 2 MG/2ML IJ SOLN
INTRAMUSCULAR | Status: AC
  Filled 2016-04-18: qty 2

## 2016-04-18 MED ORDER — GLYCOPYRROLATE 0.2 MG/ML IV SOSY
PREFILLED_SYRINGE | INTRAVENOUS | Status: DC | PRN
  Administered 2016-04-18: 0.4 mg via INTRAVENOUS

## 2016-04-18 MED ORDER — LACTATED RINGERS IV SOLN
INTRAVENOUS | Status: DC | PRN
  Administered 2016-04-18 (×2): via INTRAVENOUS

## 2016-04-18 MED ORDER — ONDANSETRON HCL 4 MG/2ML IJ SOLN
4.0000 mg | Freq: Four times a day (QID) | INTRAMUSCULAR | Status: DC | PRN
  Administered 2016-04-18 – 2016-04-19 (×2): 4 mg via INTRAVENOUS
  Filled 2016-04-18: qty 2

## 2016-04-18 MED ORDER — NALOXONE HCL 0.4 MG/ML IJ SOLN: 0.4000 mg | INTRAMUSCULAR | Status: DC | PRN

## 2016-04-18 MED ORDER — ONDANSETRON HCL 4 MG/2ML IJ SOLN
INTRAMUSCULAR | Status: DC | PRN
Start: 2016-04-18 — End: 2016-04-18
  Administered 2016-04-18: 4 mg via INTRAVENOUS

## 2016-04-18 MED ORDER — FENTANYL CITRATE (PF) 100 MCG/2ML IJ SOLN
INTRAMUSCULAR | Status: DC | PRN
  Administered 2016-04-18: 100 ug via INTRAVENOUS
  Administered 2016-04-18 (×2): 50 ug via INTRAVENOUS

## 2016-04-18 SURGICAL SUPPLY — 85 items
APPLICATOR TIP COSEAL (VASCULAR PRODUCTS) IMPLANT
APPLICATOR TIP EXT COSEAL (VASCULAR PRODUCTS) IMPLANT
ATTRACTOMAT 16X20 MAGNETIC DRP (DRAPES) ×4 IMPLANT
BLADE SURG 11 STRL SS (BLADE) IMPLANT
BRUSH CYTOL CELLEBRITY 1.5X140 (MISCELLANEOUS) IMPLANT
CANISTER SUCTION 2500CC (MISCELLANEOUS) ×4 IMPLANT
CATH KIT ON Q 5IN SLV (PAIN MANAGEMENT) IMPLANT
CATH THORACIC 28FR (CATHETERS) IMPLANT
CATH THORACIC 36FR (CATHETERS) IMPLANT
CATH THORACIC 36FR RT ANG (CATHETERS) IMPLANT
CLEANER TIP ELECTROSURG 2X2 (MISCELLANEOUS) ×8 IMPLANT
CLIP TI MEDIUM 6 (CLIP) IMPLANT
CONN ST 1/4X3/8  BEN (MISCELLANEOUS) ×1
CONN ST 1/4X3/8 BEN (MISCELLANEOUS) ×3 IMPLANT
CONT SPEC 4OZ CLIKSEAL STRL BL (MISCELLANEOUS) ×8 IMPLANT
COVER TABLE BACK 60X90 (DRAPES) ×4 IMPLANT
DERMABOND ADVANCED (GAUZE/BANDAGES/DRESSINGS) ×1
DERMABOND ADVANCED .7 DNX12 (GAUZE/BANDAGES/DRESSINGS) ×3 IMPLANT
DRAIN CHANNEL 28F RND 3/8 FF (WOUND CARE) ×4 IMPLANT
DRAPE LAPAROSCOPIC ABDOMINAL (DRAPES) ×4 IMPLANT
DRAPE PROXIMA HALF (DRAPES) ×4 IMPLANT
DRAPE SLUSH/WARMER DISC (DRAPES) ×4 IMPLANT
DRAPE WARM FLUID 44X44 (DRAPE) IMPLANT
DRILL BIT 7/64X5 (BIT) IMPLANT
ELECT BLADE 4.0 EZ CLEAN MEGAD (MISCELLANEOUS) ×4
ELECT REM PT RETURN 9FT ADLT (ELECTROSURGICAL) ×4
ELECTRODE BLDE 4.0 EZ CLN MEGD (MISCELLANEOUS) ×3 IMPLANT
ELECTRODE REM PT RTRN 9FT ADLT (ELECTROSURGICAL) ×3 IMPLANT
FORCEPS BIOP RJ4 1.8 (CUTTING FORCEPS) IMPLANT
GAUZE SPONGE 4X4 12PLY STRL (GAUZE/BANDAGES/DRESSINGS) ×4 IMPLANT
GLOVE BIO SURGEON STRL SZ 6.5 (GLOVE) ×24 IMPLANT
GLOVE BIOGEL PI IND STRL 6.5 (GLOVE) ×12 IMPLANT
GLOVE BIOGEL PI IND STRL 7.0 (GLOVE) ×6 IMPLANT
GLOVE BIOGEL PI INDICATOR 6.5 (GLOVE) ×4
GLOVE BIOGEL PI INDICATOR 7.0 (GLOVE) ×2
GOWN STRL REUS W/ TWL LRG LVL3 (GOWN DISPOSABLE) ×12 IMPLANT
GOWN STRL REUS W/ TWL XL LVL3 (GOWN DISPOSABLE) ×6 IMPLANT
GOWN STRL REUS W/TWL LRG LVL3 (GOWN DISPOSABLE) ×4
GOWN STRL REUS W/TWL XL LVL3 (GOWN DISPOSABLE) ×2
KIT BASIN OR (CUSTOM PROCEDURE TRAY) ×4 IMPLANT
KIT CLEAN ENDO COMPLIANCE (KITS) ×4 IMPLANT
KIT ROOM TURNOVER OR (KITS) ×4 IMPLANT
KIT SUCTION CATH 14FR (SUCTIONS) ×4 IMPLANT
MARKER SKIN DUAL TIP RULER LAB (MISCELLANEOUS) IMPLANT
NEEDLE BIOPSY TRANSBRONCH 21G (NEEDLE) IMPLANT
NS IRRIG 1000ML POUR BTL (IV SOLUTION) ×8 IMPLANT
OIL SILICONE PENTAX (PARTS (SERVICE/REPAIRS)) ×4 IMPLANT
PACK CHEST (CUSTOM PROCEDURE TRAY) ×4 IMPLANT
PAD ARMBOARD 7.5X6 YLW CONV (MISCELLANEOUS) ×12 IMPLANT
PASSER SUT SWANSON 36MM LOOP (INSTRUMENTS) IMPLANT
RELOAD STAPLE TA45 3.5 REG BLU (ENDOMECHANICALS) ×8 IMPLANT
SEALANT PROGEL (MISCELLANEOUS) IMPLANT
SEALANT SURG COSEAL 4ML (VASCULAR PRODUCTS) IMPLANT
SEALANT SURG COSEAL 8ML (VASCULAR PRODUCTS) IMPLANT
SOLUTION ANTI FOG 6CC (MISCELLANEOUS) ×4 IMPLANT
SPONGE GAUZE 4X4 12PLY STER LF (GAUZE/BANDAGES/DRESSINGS) ×4 IMPLANT
STAPLER ENDO NO KNIFE (STAPLE) ×4 IMPLANT
SUT PROLENE 3 0 SH DA (SUTURE) IMPLANT
SUT PROLENE 4 0 RB 1 (SUTURE)
SUT PROLENE 4-0 RB1 .5 CRCL 36 (SUTURE) IMPLANT
SUT SILK  1 MH (SUTURE) ×2
SUT SILK 1 MH (SUTURE) ×6 IMPLANT
SUT SILK 2 0SH CR/8 30 (SUTURE) IMPLANT
SUT SILK 3 0SH CR/8 30 (SUTURE) IMPLANT
SUT VIC AB 1 CTX 18 (SUTURE) IMPLANT
SUT VIC AB 1 CTX 36 (SUTURE)
SUT VIC AB 1 CTX36XBRD ANBCTR (SUTURE) IMPLANT
SUT VIC AB 2-0 CTX 36 (SUTURE) IMPLANT
SUT VIC AB 2-0 UR6 27 (SUTURE) ×8 IMPLANT
SUT VIC AB 3-0 X1 27 (SUTURE) ×8 IMPLANT
SUT VICRYL 2 TP 1 (SUTURE) IMPLANT
SWAB COLLECTION DEVICE MRSA (MISCELLANEOUS) IMPLANT
SYR 20ML ECCENTRIC (SYRINGE) IMPLANT
SYSTEM SAHARA CHEST DRAIN ATS (WOUND CARE) ×4 IMPLANT
TAPE CLOTH 4X10 WHT NS (GAUZE/BANDAGES/DRESSINGS) ×4 IMPLANT
TAPE CLOTH SURG 4X10 WHT LF (GAUZE/BANDAGES/DRESSINGS) ×4 IMPLANT
TIP APPLICATOR SPRAY EXTEND 16 (VASCULAR PRODUCTS) IMPLANT
TOWEL OR 17X24 6PK STRL BLUE (TOWEL DISPOSABLE) ×4 IMPLANT
TOWEL OR 17X26 10 PK STRL BLUE (TOWEL DISPOSABLE) ×4 IMPLANT
TRAP SPECIMEN MUCOUS 40CC (MISCELLANEOUS) IMPLANT
TRAY FOLEY CATH SILVER 16FR LF (SET/KITS/TRAYS/PACK) ×4 IMPLANT
TROCAR BLADELESS 12MM (ENDOMECHANICALS) IMPLANT
TUBE ANAEROBIC SPECIMEN COL (MISCELLANEOUS) IMPLANT
TUBE CONNECTING 20X1/4 (TUBING) IMPLANT
WATER STERILE IRR 1000ML POUR (IV SOLUTION) ×8 IMPLANT

## 2016-04-18 NOTE — Transfer of Care (Signed)
Immediate Anesthesia Transfer of Care Note  Patient: [name] ChafeMarvin [name]  Procedure(s) Performed: Procedure(s): VIDEO ASSISTED THORACOSCOPY (Left) STAPLING OF BLEBS (Left) VIDEO BRONCHOSCOPY (N/A) Mechanical PLEURADESIS (Left)  Patient Location: PACU  Anesthesia Type:General  Level of Consciousness: awake, alert , oriented and patient cooperative  Airway & Oxygen Therapy: Patient Spontanous Breathing and Patient connected to nasal cannula oxygen  Post-op Assessment: Report given to RN, Post -op Vital signs reviewed and stable and Patient moving all extremities X 4  Post vital signs: Reviewed and stable  Last Vitals:  Vitals:   04/18/16 0619  BP: 125/86  Pulse: 96  Resp: 16  Temp: 36.9 C    Last Pain:  Vitals:   04/18/16 0619  TempSrc: Oral         Complications: No apparent anesthesia complications

## 2016-04-18 NOTE — Anesthesia Postprocedure Evaluation (Signed)
Anesthesia Post Note  Patient: [name] ChafeMarvin [name]  Procedure(s) Performed: Procedure(s) (LRB): VIDEO ASSISTED THORACOSCOPY (Left) STAPLING OF BLEBS (Left) VIDEO BRONCHOSCOPY (N/A) Mechanical PLEURADESIS (Left)  Patient location during evaluation: PACU Anesthesia Type: General Level of consciousness: awake and alert Pain management: pain level controlled Vital Signs Assessment: post-procedure vital signs reviewed and stable Respiratory status: spontaneous breathing, nonlabored ventilation, respiratory function stable and patient connected to nasal cannula oxygen Cardiovascular status: blood pressure returned to baseline and stable Postop Assessment: no signs of nausea or vomiting Anesthetic complications: no       Last Vitals:  Vitals:   04/18/16 1114 04/18/16 1117  BP: 135/86   Pulse: 86   Resp: (!) 21 (!) 22  Temp:      Last Pain:  Vitals:   04/18/16 1117  TempSrc:   PainSc: 7                  Kourtney Montesinos,W. EDMOND

## 2016-04-18 NOTE — H&P (Signed)
[address]       [place] [address]             [phone]                    [name] [name] Medical Record #[id number] Date of Birth: 1990/05/01  Referring: [name] Primary Care: No PCP Per Patient  Chief Complaint:    Recurrent PTX on the left   History of Present Illness:    [name] [name] 26 y.o. male is seen in the office  today for recurrent left pneumothorax. The patient notes initial episode of a left pneumothorax in late July 2017. He was admitted to [facility] and a pigtail catheter was placed into the left chest, he was never seen by thoracic surgery. In late December and he had recurrent symptoms of left chest pain and shortness of breath and was seen in [place]. He was admitted there and a second pigtail catheter is placed into the left chest. He was then discharged home. January 4 he had some left chest discomfort was admitted to [facility] by the pulmonary service. No chest tube was placed. Patient understanding was that he was kept in the hospital until yesterday to flush out the nitrogen from his system.  had called the office last week and arrange for him to be seen today to consider left VATS for recurrent pneumothoraces.   The patient occasionally smokes, he's had no known history of underlying pulmonary disease, he denies childhood asthma.      Current Activity/ Functional Status:  Patient is independent with mobility/ambulation, transfers, ADL's, IADL's.   Zubrod Score: At the time of surgery this patient's most appropriate activity status/level should be described as: [x]     0    Normal activity, no symptoms []     1    Restricted in physical strenuous activity but ambulatory, able to do out light work []     2    Ambulatory and capable of self care, unable to do work activities, up and about               >50 % of waking hours                              []     3    Only limited self care, in bed greater than 50% of  waking hours []     4    Completely disabled, no self care, confined to bed or chair []     5    Moribund   Past Medical History:  Diagnosis Date  . Bradycardia, sinus    States he's been told it's an athletic heart rate  . Spontaneous pneumothorax 04/13/2016   previous pneumothorax  in 10/2015    Past Surgical History:  Procedure Laterality Date  . CHEST TUBE INSERTION Left 10/2015   also on 04/01/16    Family History: Patient denies any family history of pneumothorax Both his mother and father alive and well  Social History   Social History  . Marital status: Unknown    Spouse name: N/A  . Number of children: N/A  . Years of education: N/A   Occupational History  . 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 Smoking status: Former  Smoker    Types: Cigarettes    Quit date: 01/16/2016  . Smokeless tobacco: Never Used     Comment: ONLY SMOKED EVERY NOW AND THEN  . Alcohol use No     Comment: occasional  . Drug use: No  . Sexual activity: Not on file   Other Topics Concern  . Not on file   Social History Narrative  . No narrative on file    History  Smoking Status  . Former Smoker  . Types: Cigarettes  . Quit date: 01/16/2016  Smokeless Tobacco  . Never Used    Comment: ONLY SMOKED EVERY NOW AND THEN    History  Alcohol Use No    Comment: occasional     Allergies  Allergen Reactions  . No Known Allergies     Current Facility-Administered Medications  Medication Dose Route Frequency Provider Last Rate Last Dose  . 0.9 % irrigation (POUR BTL)    PRN [name], MD   2,000 mL at 04/18/16 0705  . cefUROXime (ZINACEF) 1.5 g in dextrose 5 % 50 mL IVPB  1.5 g Intravenous 60 min Pre-Op [name], MD          Review of Systems:     Cardiac Review of Systems: Y or N  Chest Pain [ y   ]  Resting SOB [ n  ] Exertional SOB  [name]  ]    [name] [name]  ]   Pedal Edema [ n  ]    Palpitations [name]  ] Syncope  [ n ]   Presyncope [   ]  General Review of Systems: [Y] = yes [  ]=no Constitional: recent weight change [  ];  Wt loss over the last 3 months [   ] anorexia [  ]; fatigue [  ]; nausea [  ]; night sweats [  ]; fever [ n ]; or chills [  ];          Dental: poor dentition[  ]; Last Dentist visit:   Eye : blurred vision [  ]; diplopia [   ]; vision changes [  ];  Amaurosis fugax[  ]; Resp: cough [ y ];  wheezing[ n ];  hemoptysis[n  ]; shortness of breath[  ]; paroxysmal nocturnal dyspnea[  ]; dyspnea on exertion[  ]; or orthopnea[  ];  GI:  gallstones[  ], vomiting[  ];  dysphagia[  ]; melena[  ];  hematochezia [  ]; heartburn[  ];   Hx of  Colonoscopy[  ]; GU: kidney stones [  ]; hematuria[  ];   dysuria [  ];  nocturia[  ];  history of     obstruction [  ]; urinary frequency [  ]             Skin: rash, swelling[  ];, hair loss[  ];  peripheral edema[  ];  or itching[  ]; Musculosketetal: myalgias[  ];  joint swelling[  ];  joint erythema[  ];  joint pain[  ];  back pain[  ];  Heme/Lymph: bruising[  ];  bleeding[  ];  anemia[  ];  Neuro: TIA[ n ];  headaches[  ];  stroke[  ];  vertigo[  ];  seizures[  ];   paresthesias[  ];  difficulty walking[  ];  Psych:depression[n  ]; anxiety[n  ];  Endocrine: diabetes[  ];  thyroid dysfunction[  ];  Immunizations: Flu up to date [ y ]; Pneumococcal up  to date [ n ];  Other:  Physical Exam: BP 125/86   Pulse 96   Temp 98.5 F (36.9 C) (Oral)   Resp 16   Ht 6' (1.829 m)   Wt 133 lb (60.3 kg)   SpO2 100%   BMI 18.04 kg/m   PHYSICAL EXAMINATION: General appearance: alert, cooperative and no distress Head: Normocephalic, without obvious abnormality, atraumatic Neck: no adenopathy, no carotid bruit, no JVD, supple, symmetrical, trachea midline and thyroid not enlarged, symmetric, no tenderness/mass/nodules Lymph nodes: Cervical, supraclavicular, and axillary nodes normal. Resp:  clear to auscultation bilaterally Back: symmetric, no curvature. ROM normal. No CVA tenderness. Cardio: regular rate and rhythm, S1, S2 normal, no murmur, click, rub or gallop GI: soft, non-tender; bowel sounds normal; no masses,  no organomegaly Extremities: extremities normal, atraumatic, no cyanosis or edema Neurologic: Grossly normal  Diagnostic Studies & Laboratory data:     Recent Radiology Findings:  .ri  Dg Chest 2 View  Result Date: 04/17/2016 CLINICAL DATA:  Spontaneous pneumothorax. EXAM: CHEST  2 VIEW COMPARISON:  04/16/2016. FINDINGS: No acute cardiopulmonary disease. Heart size normal. No pleural effusion or pneumothorax. No acute bony abnormality . IMPRESSION: No acute cardiopulmonary disease. Electronically Signed   By: Maisie Fus  Register   On: 04/17/2016 14:57   Dg Chest 2 View  Result Date: 04/16/2016 CLINICAL DATA:  Left chest pain. History of recurrent left pneumothorax. EXAM: CHEST  2 VIEW COMPARISON:  Single-view of the chest 04/15/2004 FINDINGS: No pneumothorax is identified. Both lungs are expanded and clear. Heart size is normal. No bony abnormality. IMPRESSION: Negative for pneumothorax.  No acute disease. Electronically Signed   By: Drusilla Kanner M.D.   On: 04/16/2016 10:32   Dg Chest 2 View  Result Date: 04/13/2016 CLINICAL DATA:  Left-sided chest pain EXAM: CHEST  2 VIEW COMPARISON:  11/04/2015 FINDINGS: There is a small left apical pneumothorax measuring less than 10%. There is no focal parenchymal opacity. There is no pleural effusion. There is no right pneumothorax. The heart and mediastinal contours are unremarkable. The osseous structures are unremarkable. IMPRESSION: Small left apical pneumothorax measuring less than 10%. Critical Value/emergent results were called by telephone at the time of interpretation on 04/13/2016 at 10:02 am to Dr. Marily Memos , who verbally acknowledged these results. Electronically Signed   By: Elige Ko   On: 04/13/2016 10:10   Ct  Chest Wo Contrast  Result Date: 04/13/2016 CLINICAL DATA:  Left-sided chest pain EXAM: CT CHEST WITHOUT CONTRAST TECHNIQUE: Multidetector CT imaging of the chest was performed following the standard protocol without IV contrast. COMPARISON:  None. FINDINGS: Cardiovascular: No significant vascular findings. Normal heart size. No pericardial effusion. Mediastinum/Nodes: No enlarged mediastinal or axillary lymph nodes. Thyroid gland, trachea, and esophagus demonstrate no significant findings. Lungs/Pleura: Lungs are clear. No pneumothorax. No focal consolidation. Small apical pneumothorax measuring less than 10%. Upper Abdomen: No acute abnormality. Musculoskeletal: No chest wall mass or suspicious bone lesions identified. IMPRESSION: 1. Small left apical pneumothorax measuring less than 10%. No apical bleb or bulla. Electronically Signed   By: Elige Ko   On: 04/13/2016 11:50   CLINICAL DATA:  Left-sided chest pain  EXAM: CT CHEST WITHOUT CONTRAST  TECHNIQUE: Multidetector CT imaging of the chest was performed following the standard protocol without IV contrast.  COMPARISON:  None.  FINDINGS: Cardiovascular: No significant vascular findings. Normal heart size. No pericardial effusion.  Mediastinum/Nodes: No enlarged mediastinal or axillary lymph nodes. Thyroid gland, trachea, and esophagus demonstrate no significant findings.  Lungs/Pleura: Lungs are clear. No pneumothorax. No focal consolidation. Small apical pneumothorax measuring less than 10%.  Upper Abdomen: No acute abnormality.  Musculoskeletal: No chest wall mass or suspicious bone lesions identified.  IMPRESSION: 1. Small left apical pneumothorax measuring less than 10%. No apical bleb or bulla.   Electronically Signed   By: Elige KoHetal  Patel   On: 04/13/2016 11:50  I have independently reviewed the above radiology studies  and reviewed the findings with the patient.      Recent Lab Findings: Lab Results    Component Value Date   WBC 8.9 04/18/2016   HGB 13.0 04/18/2016   HCT 38.9 (L) 04/18/2016   PLT 248 04/18/2016   GLUCOSE 99 04/18/2016   ALT 30 04/18/2016   AST 39 04/18/2016   NA 138 04/18/2016   K 3.7 04/18/2016   CL 104 04/18/2016   CREATININE 1.11 04/18/2016   BUN 22 (H) 04/18/2016   CO2 22 04/18/2016   INR 1.11 04/18/2016      Assessment / Plan:   With the patient's recurrent left pneumothorax at least twice if not 3 times in the past 6 months recommended to him that we proceed with. Risks and options and expectations  of surgery are reviewed with him in detail. He is will to pproceed tomorrow.    The goals risks and alternatives of the planned surgical procedure   bronchoscopy left video-assisted thoracoscopy for stapling of blebs have been discussed with the patient in detail. The risks of the procedure including death, infection, stroke, myocardial infarction, bleeding, blood transfusion have all been discussed specifically.  I have quoted Karen ChafeMarvin Nevills a 1 % of perioperative mortality and a complication rate as high as 10 %. The patient's questions have been answered.Karen ChafeMarvin Mathe is willing  to proceed with the planned procedure.   Delight OvensEdward B Mercury Rock MD      [address] [place] [id number] Office [phone]   Beeper [phone]  04/18/2016 7:11 AM

## 2016-04-18 NOTE — Brief Op Note (Addendum)
      [address]       [place] [id number]             [phone]     04/18/2016  10:11 AM  PATIENT:  [name] ChafeMarvin [name]  26 y.o. male  PRE-OPERATIVE DIAGNOSIS:  RECURRENT SPONTANEOUS PNEUMOTHORAX  POST-OPERATIVE DIAGNOSIS:  RECURRENT SPONTANEOUS PNEUMOTHORAX  PROCEDURE:  Procedure(s): VIDEO ASSISTED THORACOSCOPY (Left) STAPLING OF BLEBS (Left) VIDEO BRONCHOSCOPY (N/A) Mechanical PLEURADESIS (Left)  SURGEON:  Surgeon(s) and Role:    * Delight OvensEdward B Madoc Holquin, MD - Primary  PHYSICIAN ASSISTANT:  Jari Favreessa Conte, PA-C   ANESTHESIA:   general  EBL:  No intake/output data recorded.  BLOOD ADMINISTERED:none  DRAINS: one blake in the left pleural space   LOCAL MEDICATIONS USED:  NONE  SPECIMEN:  No Specimen  DISPOSITION OF SPECIMEN:  N/A  COUNTS:  YES   DICTATION: .Other Dictation: Dictation Number pending  PLAN OF CARE: Admit to inpatient   PATIENT DISPOSITION:  PACU - hemodynamically stable.   Delay start of Pharmacological VTE agent (>24hrs) due to surgical blood loss or risk of bleeding: yes

## 2016-04-18 NOTE — Anesthesia Procedure Notes (Signed)
Procedure Name: Intubation Date/Time: 04/18/2016 8:00 AM Performed by: Carney Living Pre-anesthesia Checklist: Patient identified, Emergency Drugs available, Suction available, Patient being monitored and Timeout performed Patient Re-evaluated:Patient Re-evaluated prior to inductionOxygen Delivery Method: Circle system utilized Preoxygenation: Pre-oxygenation with 100% oxygen Intubation Type: IV induction Ventilation: Mask ventilation without difficulty Laryngoscope Size: Mac and 4 Grade View: Grade I Endobronchial tube: Double lumen EBT, EBT position confirmed by auscultation and EBT position confirmed by fiberoptic bronchoscope and 39 Fr Laser Tube: Cuffed inflated with minimal occlusive pressure - saline Number of attempts: 1 Airway Equipment and Method: Stylet Placement Confirmation: ETT inserted through vocal cords under direct vision,  positive ETCO2 and breath sounds checked- equal and bilateral Secured at: 29 cm Tube secured with: Tape Dental Injury: Teeth and Oropharynx as per pre-operative assessment

## 2016-04-19 ENCOUNTER — Encounter (HOSPITAL_COMMUNITY): Payer: Self-pay | Admitting: Cardiothoracic Surgery

## 2016-04-19 ENCOUNTER — Inpatient Hospital Stay (HOSPITAL_COMMUNITY): Payer: BLUE CROSS/BLUE SHIELD

## 2016-04-19 LAB — GLUCOSE, CAPILLARY
Glucose-Capillary: 103 mg/dL — ABNORMAL HIGH (ref 65–99)
Glucose-Capillary: 113 mg/dL — ABNORMAL HIGH (ref 65–99)
Glucose-Capillary: 127 mg/dL — ABNORMAL HIGH (ref 65–99)
Glucose-Capillary: 120 mg/dL — ABNORMAL HIGH (ref 65–99)
Glucose-Capillary: 128 mg/dL — ABNORMAL HIGH (ref 65–99)

## 2016-04-19 LAB — BLOOD GAS, ARTERIAL
ACID-BASE EXCESS: 3.7 mmol/L — AB (ref 0.0–2.0)
Bicarbonate: 28.4 mmol/L — ABNORMAL HIGH (ref 20.0–28.0)
DRAWN BY: 398981
O2 Content: 2 L/min
O2 SAT: 99 %
Patient temperature: 98.6
pCO2 arterial: 47.9 mmHg (ref 32.0–48.0)
pH, Arterial: 7.39 (ref 7.350–7.450)
pO2, Arterial: 151 mmHg — ABNORMAL HIGH (ref 83.0–108.0)

## 2016-04-19 LAB — BASIC METABOLIC PANEL
Anion gap: 7 (ref 5–15)
BUN: 6 mg/dL (ref 6–20)
CO2: 27 mmol/L (ref 22–32)
Calcium: 9.5 mg/dL (ref 8.9–10.3)
Chloride: 103 mmol/L (ref 101–111)
Creatinine, Ser: 1.03 mg/dL (ref 0.61–1.24)
GFR calc Af Amer: >60 mL/min (ref >60)
GFR calc non Af Amer: >60 mL/min (ref >60)
Glucose, Bld: 107 mg/dL — ABNORMAL HIGH (ref 65–99)
Potassium: 4.2 mmol/L (ref 3.5–5.1)
Sodium: 137 mmol/L (ref 135–145)

## 2016-04-19 LAB — CBC
HCT: 23.2 % — ABNORMAL LOW (ref 39.0–52.0)
HCT: 40 % (ref 39.0–52.0)
Hemoglobin: 7.6 g/dL — ABNORMAL LOW (ref 13.0–17.0)
Hemoglobin: 12.9 g/dL — ABNORMAL LOW (ref 13.0–17.0)
MCH: 23.5 pg — ABNORMAL LOW (ref 26.0–34.0)
MCH: 23.1 pg — ABNORMAL LOW (ref 26.0–34.0)
MCHC: 32.8 g/dL (ref 30.0–36.0)
MCHC: 32.3 g/dL (ref 30.0–36.0)
MCV: 71.6 fL — ABNORMAL LOW (ref 78.0–100.0)
MCV: 71.6 fL — ABNORMAL LOW (ref 78.0–100.0)
PLATELETS: 129 10*3/uL — AB (ref 150–400)
Platelets: 246 10*3/uL (ref 150–400)
RBC: 3.24 MIL/uL — ABNORMAL LOW (ref 4.22–5.81)
RBC: 5.59 MIL/uL (ref 4.22–5.81)
RDW: 16.3 % — AB (ref 11.5–15.5)
RDW: 16.4 % — ABNORMAL HIGH (ref 11.5–15.5)
WBC: 5.7 10*3/uL (ref 4.0–10.5)
WBC: 8.9 10*3/uL (ref 4.0–10.5)

## 2016-04-19 LAB — MRSA PCR SCREENING: MRSA by PCR: NEGATIVE

## 2016-04-19 MED ORDER — LEVALBUTEROL HCL 0.63 MG/3ML IN NEBU
0.6300 mg | INHALATION_SOLUTION | Freq: Four times a day (QID) | RESPIRATORY_TRACT | Status: DC | PRN
Start: 2016-04-19 — End: 2016-04-21

## 2016-04-19 NOTE — Progress Notes (Signed)
Patient ID: [name] ChafeMarvin [name], male   DOB: Feb 12, 1991, 26 y.o.   MRN: [id number] TCTS DAILY ICU PROGRESS NOTE                   [address]            [name] [id number]          [phone]   1 Day Post-Op Procedure(s) (LRB): VIDEO ASSISTED THORACOSCOPY (Left) STAPLING OF BLEBS (Left) VIDEO BRONCHOSCOPY (N/A) Mechanical PLEURADESIS (Left)  Total Length of Stay:  LOS: 1 day   Subjective: Feels well, good pain control  Objective: Vital signs in last 24 hours: Temp:  [97.2 F (36.2 C)-99 F (37.2 C)] 98.8 F (37.1 C) (01/10 0735) Pulse Rate:  [55-108] 55 (01/10 0739) Cardiac Rhythm: Sinus bradycardia (01/10 0742) Resp:  [16-31] 19 (01/10 0742) BP: (111-139)/(63-92) 119/63 (01/10 0739) SpO2:  [98 %-100 %] 100 % (01/10 0742) Arterial Line BP: (145-176)/(63-80) 160/76 (01/09 1826)  Filed Weights   04/18/16 0607  Weight: 133 lb (60.3 kg)    Weight change:    Hemodynamic parameters for last 24 hours:    Intake/Output from previous day: 01/09 0701 - 01/10 0700 In: 2600 [I.V.:2600] Out: 3390 [Urine:3100; Blood:100; Chest Tube:190]  Intake/Output this shift: Total I/O In: -  Out: 40 [Chest Tube:40]  Current Meds: Scheduled Meds: . acetaminophen  1,000 mg Oral Q6H   Or  . acetaminophen (TYLENOL) oral liquid 160 mg/5 mL  1,000 mg Oral Q6H  . bisacodyl  10 mg Oral Daily  . fentaNYL   Intravenous Q4H  . insulin aspart  0-24 Units Subcutaneous Q4H  . senna-docusate  1 tablet Oral QHS   Continuous Infusions: . dextrose 5 % and 0.45% NaCl 100 mL/hr (04/18/16 1054)   PRN Meds:.diphenhydrAMINE **OR** diphenhydrAMINE, levalbuterol, naloxone **AND** sodium chloride flush, ondansetron (ZOFRAN) IV, oxyCODONE, potassium chloride (KCL MULTIRUN) 30 mEq in 265 mL IVPB, traMADol  General appearance: alert, appears older than stated age and no distress Neurologic: intact Heart: regular rate and rhythm, S1, S2 normal, no murmur, click, rub or gallop Lungs: clear  to auscultation bilaterally Abdomen: soft, non-tender; bowel sounds normal; no masses,  no organomegaly Extremities: extremities normal, atraumatic, no cyanosis or edema and Homans sign is negative, no sign of DVT Wound: intact, no air leak  Lab Results: CBC: Recent Labs  04/18/16 0609 04/19/16 0500  WBC 8.9 5.7  HGB 13.0 7.6*  HCT 38.9* 23.2*  PLT 248 129*   BMET:  Recent Labs  04/18/16 0609 04/19/16 0630  NA 138 137  K 3.7 4.2  CL 104 103  CO2 22 27  GLUCOSE 99 107*  BUN 22* 6  CREATININE 1.11 1.03  CALCIUM 10.0 9.5    CMET: Lab Results  Component Value Date   WBC 5.7 04/19/2016   HGB 7.6 (L) 04/19/2016   HCT 23.2 (L) 04/19/2016   PLT 129 (L) 04/19/2016   GLUCOSE 107 (H) 04/19/2016   ALT 30 04/18/2016   AST 39 04/18/2016   NA 137 04/19/2016   K 4.2 04/19/2016   CL 103 04/19/2016   CREATININE 1.03 04/19/2016   BUN 6 04/19/2016   CO2 27 04/19/2016   INR 1.11 04/18/2016    PT/INR:  Recent Labs  04/18/16 0609  LABPROT 14.3  INR 1.11   Radiology: Dg Chest Port 1 View  Result Date: 04/18/2016 CLINICAL DATA:  Status post video-assisted thoracoscopy. History of spontaneous left pneumothorax. EXAM: PORTABLE CHEST 1 VIEW COMPARISON:  PA  and lateral chest x-ray of April 18, 2015 FINDINGS: The lungs are well-expanded. No residual pneumothorax on the left is identified. The left chest tube has its tip in the pulmonary apex medially. The mediastinum is not shifted. There is no pleural effusion. The heart and pulmonary vascularity are normal. There is moderate gaseous distention of the stomach. The bony thorax exhibits no acute abnormality. IMPRESSION: No acute cardiopulmonary abnormality. No left-sided pneumothorax. The left chest tube tip projects in the left cardiac apex. Electronically Signed   By: David  [place]az[place] M.D.   On: 04/18/2016 11:31     Assessment/Plan: S/P Procedure(s) (LRB): VIDEO ASSISTED THORACOSCOPY (Left) STAPLING OF BLEBS (Left) VIDEO  BRONCHOSCOPY (N/A) Mechanical PLEURADESIS (Left) Mobilize See progression orders H/H large drop with out any obvious source of blood loss or hemodynamic consequence , repeat pending suspect lab error  Ct to water seal  Decrease iv rate    [name] [name] 04/19/2016 7:55 AM

## 2016-04-19 NOTE — Care Management Note (Signed)
Case Management Note  Patient Details  Name: [name] [name] MRN: [id number] [id number] Date of Birth: [birth year]  Subjective/Objective:  S/p VATS, stapling ob Blebs, has chest tube to water seal today, hopefully will dc chest tube tomorrow and dc home by Friday,  NCM will cont to follow for dc needs.                    Action/Plan:   Expected Discharge Date:  04/21/16               Expected Discharge Plan:  Home/Self Care  In-House Referral:     Discharge planning Services  CM Consult  Post Acute Care Choice:    Choice offered to:     DME Arranged:    DME Agency:     HH Arranged:    HH Agency:     Status of Service:  Completed, signed off  If discussed at [employer] Length of Stay Meetings, dates discussed:    Additional Comments:  Leone Havenaylor, Rica Heather Clinton, RN 04/19/2016, 12:10 PM

## 2016-04-20 ENCOUNTER — Inpatient Hospital Stay (HOSPITAL_COMMUNITY): Payer: BLUE CROSS/BLUE SHIELD

## 2016-04-20 LAB — GLUCOSE, CAPILLARY
Glucose-Capillary: 111 mg/dL — ABNORMAL HIGH (ref 65–99)
Glucose-Capillary: 115 mg/dL — ABNORMAL HIGH (ref 65–99)
Glucose-Capillary: 139 mg/dL — ABNORMAL HIGH (ref 65–99)
Glucose-Capillary: 142 mg/dL — ABNORMAL HIGH (ref 65–99)
Glucose-Capillary: 146 mg/dL — ABNORMAL HIGH (ref 65–99)

## 2016-04-20 LAB — CBC
HCT: 39.8 % (ref 39.0–52.0)
Hemoglobin: 12.9 g/dL — ABNORMAL LOW (ref 13.0–17.0)
MCH: 23.4 pg — ABNORMAL LOW (ref 26.0–34.0)
MCHC: 32.4 g/dL (ref 30.0–36.0)
MCV: 72.1 fL — ABNORMAL LOW (ref 78.0–100.0)
PLATELETS: 218 10*3/uL (ref 150–400)
RBC: 5.52 MIL/uL (ref 4.22–5.81)
RDW: 16.4 % — AB (ref 11.5–15.5)
WBC: 8.1 10*3/uL (ref 4.0–10.5)

## 2016-04-20 LAB — COMPREHENSIVE METABOLIC PANEL
ALBUMIN: 3.9 g/dL (ref 3.5–5.0)
ALT: 31 U/L (ref 17–63)
ANION GAP: 9 (ref 5–15)
AST: 39 U/L (ref 15–41)
Alkaline Phosphatase: 46 U/L (ref 38–126)
BUN: 9 mg/dL (ref 6–20)
CO2: 29 mmol/L (ref 22–32)
Calcium: 9.7 mg/dL (ref 8.9–10.3)
Chloride: 99 mmol/L — ABNORMAL LOW (ref 101–111)
Creatinine, Ser: 1.1 mg/dL (ref 0.61–1.24)
GFR calc Af Amer: >60 mL/min (ref >60)
GFR calc non Af Amer: >60 mL/min (ref >60)
GLUCOSE: 97 mg/dL (ref 65–99)
POTASSIUM: 4.3 mmol/L (ref 3.5–5.1)
SODIUM: 137 mmol/L (ref 135–145)
Total Bilirubin: 0.9 mg/dL (ref 0.3–1.2)
Total Protein: 7 g/dL (ref 6.5–8.1)

## 2016-04-20 NOTE — Op Note (Signed)
NAMKaren [name]:  [name] [name]              ACCOUNT NO.:  [id number]  MEDICAL RECORD NO.:  [id number]  LOCATION:                                 FACILITY:  PHYSICIAN:  Sheliah PlaneEdward Laurier Jasperson, MD    DATE OF BIRTH:  1990/06/09  DATE OF PROCEDURE:  04/18/2016 DATE OF DISCHARGE:                              OPERATIVE REPORT   PREOPERATIVE DIAGNOSIS:  Recurrent left pneumothoraces.  POSTOPERATIVE DIAGNOSIS:  Recurrent left pneumothoraces.  SURGICAL PROCEDURE:  Video bronchoscopy, left video-assisted thoracoscopy, stapling of apical blebs, and mechanical pleurodesis.  SURGEON:  Sheliah PlaneEdward Raylee Strehl, M.D.  FIRST ASSISTANT:  Jari Favreessa Conte, [place].  BRIEF HISTORY:  The patient is a 26 year old male who has been followed by the Pulmonary Service since July 2017 when he presented with a left pneumothorax.  A pigtail catheter was placed at that time into the left chest.  Ultimately, he was discharged home.  Again, he presented to hospital in [place] in late December with a recurrent left pneumothorax and a second chest tube was placed.  This was removed and he was discharged home.  He then noted increasing left chest discomfort and [facility] where he had a small less than 10% pneumothorax.  A CT scan had been done in the past.  No definite enlarged bleb was noted, but there was area at the apex that probably represented a pot bleb.  He had no other pulmonary cysts or blebs.  The patient was sent to the office by the Pulmonary Service for consideration of VATS because of recurrent pneumothoraces.  Risks and options of video-assisted thoracoscopy for the treatment of recurrent pneumothorax was discussed with the patient who agreed and signed informed consent.  DESCRIPTION OF PROCEDURE:  The patient underwent general endotracheal anesthesia with a double-lumen endotracheal tube.  With induction of anesthesia, he had significant drop in blood pressure even before any operative  procedure was started.  He was given Valium and ephedrine ultimately his pressure and heart rate stabilized and he remained stable through the remainder of the procedure.  Appropriate time-out was performed and a fiberoptic bronchoscope was passed down the double-lumen endotracheal tube both to the right and left tracheobronchial tree.  The tube was in good position and there were no endobronchial lesions appreciated.  The patient was then turned in lateral decubitus position with left side up.  The left chest was prepped with Betadine and draped in usual sterile manner.  A second time-out was performed and the previously marked side was confirmed approximately 6 intercostal space midaxillary line.  A small port incision was made and the left chest was entered with a 30-degree video scope.  With gentle inflation of the left lung and small amount of saline in the chest, no specific air leak could be identified, although small bubbles near the apex were appreciated.  A second port site was placed slightly more superior and anterior and the third port site slightly more superior and posterior.  Using lung graspers through the port sites, the lung was manipulated to examine carefully.  There were no blebs in the lower lobe lingula.  At the apex, there was a small area that probably  represented an area where bleb had popped.  We decided to staple across this upper area as this was most likely source of the previous air leaks.  Through the port site, a 45 no- knife stapler was fired twice across the apex.  We then used a mechanical pleurodesis to further prevent recurrent pneumothorax.  With this completed, the lung was inflated and no air leaks were noted. Through the anterior port site, a 28 Blake drain was left as a chest tube, the other 2 remaining port sites were closed with 2-0 Vicryl in the deep layers, and 3-0 subcuticular stitch.  Dermabond was placed on the sites.  The patient's left  lung inflated well, and there was no air leak from the chest tube.  The patient was awakened and extubated in the operating room.  Sponge and needle count was reported correct at the completion of procedure.  Blood loss was minimal.  The patient tolerated the procedure without obvious complication, was transferred to the recovery room for further postoperative care.     Sheliah Plane, MD   ______________________________ Sheliah Plane, MD    EG/MEDQ  D:  04/19/2016  T:  04/20/2016  Job:  [id number]

## 2016-04-20 NOTE — Discharge Summary (Signed)
Physician Discharge Summary       [address]       [place] [address]             [phone]    Patient ID: [name] [name] MRN: [id number] DOB/AGE: 06-Mar-1991 26 y.o.  Admit date: 04/18/2016 Discharge date: 04/21/2016  Admission Diagnoses: Recurrent left pneumothoraces  Active Diagnoses:  1.   Bleb, lung (HCC) 2. Tobacco abuse 3. Bradycardia   Procedure (s):  Video bronchoscopy, left video-assisted thoracoscopy, stapling of apical blebs, and mechanical pleurodesis by Dr. Tyrone Sage on 04/18/2016.  Pathology: Results pending.  History of Presenting Illness: [name] [name] 26 y.o. male is seen in the office  today for recurrent left pneumothorax. The patient notes initial episode of a left pneumothorax in late July 2017. He was admitted to [facility] and a pigtail catheter was placed into the left chest, he was never seen by thoracic surgery. In late December and he had recurrent symptoms of left chest pain and shortness of breath and was seen in [place]. He was admitted there and a second pigtail catheter is placed into the left chest. He was then discharged home. January 4 he had some left chest discomfort was admitted to [facility] by the pulmonary service. No chest tube was placed. Patient understanding was that he was kept in the hospital until yesterday to flush out the nitrogen from his system.  had called the office last week and arrange for him to be seen today to consider left VATS for recurrent pneumothoraces.   The patient occasionally smokes, he's had no known history of underlying pulmonary disease, he denies childhood asthma. With the patient's recurrent left pneumothorax at least twice if not 3 times in the past 6 months, it was recommended to him that we proceed with surgery. Risks, options, and expectations of surgery were reviewed with him in detail. He underwent a bronchoscopy, left VATS, stapling of blebs, and mechanical pleurodesis on  04/20/2016.   Brief Hospital Course:  The patient remained afebrile and hemodynamically stable. A line and foley were removed early in the post operative course. Chest tube output gradually decreased. Daily chest x rays were obtained and remained stable. There was no air leak. Chest tube was placed to water seal on 01/10. It was then removed on 04/20/2016. Patient is ambulating on room air. Patient is tolerating a diet and has had a bowel movement. Wounds are clean and dry. Final chest X ray showed a questionable,minimal 5% apical pneumothorax. Dr.Gerhardt reviewed the chest x ray and felt the patient is felt surgically stable for discharge today.  Discharge Condition:Stable and discharged to home.  Recent laboratory studies:  Lab Results  Component Value Date   WBC 8.1 04/20/2016   HGB 12.9 (L) 04/20/2016   HCT 39.8 04/20/2016   MCV 72.1 (L) 04/20/2016   PLT 218 04/20/2016   Lab Results  Component Value Date   NA 137 04/20/2016   K 4.3 04/20/2016   CL 99 (L) 04/20/2016   CO2 29 04/20/2016   CREATININE 1.10 04/20/2016   GLUCOSE 97 04/20/2016    Diagnostic Studies:  Ct Chest Wo Contrast  Result Date: 04/13/2016 CLINICAL DATA:  Left-sided chest pain EXAM: CT CHEST WITHOUT CONTRAST TECHNIQUE: Multidetector CT imaging of the chest was performed following the standard protocol without IV contrast. COMPARISON:  None. FINDINGS: Cardiovascular: No significant vascular findings. Normal heart size. No pericardial effusion. Mediastinum/Nodes: No enlarged mediastinal or axillary lymph nodes. Thyroid gland, trachea, and esophagus demonstrate no  significant findings. Lungs/Pleura: Lungs are clear. No pneumothorax. No focal consolidation. Small apical pneumothorax measuring less than 10%. Upper Abdomen: No acute abnormality. Musculoskeletal: No chest wall mass or suspicious bone lesions identified. IMPRESSION: 1. Small left apical pneumothorax measuring less than 10%. No apical bleb or bulla.  Electronically Signed   By: Elige KoHetal  Patel   On: 04/13/2016 11:50   CLINICAL DATA:  Followup left pneumothorax.  EXAM: CHEST  2 VIEW  COMPARISON:  04/20/2016  FINDINGS: Left chest tube has been removed. There is a tiny amount of pleural air in the left upper thorax, less than 5%. Surgical changes at the left apex as seen previously. The lung as well aerated. The right chest is clear.  IMPRESSION: Left chest tube removed. Tiny amount of pleural air at the left apex, less than 5%.   Electronically Signed   By: Paulina FusiMark  Shogry M.D.   On: 04/21/2016 07:41   Discharge Medications: Allergies as of 04/21/2016      Reactions   No Known Allergies       Medication List    TAKE these medications   acetaminophen 500 MG tablet Commonly known as:  TYLENOL Take 2 tablets (1,000 mg total) by mouth every 6 (six) hours as needed for mild pain or fever.   traMADol 50 MG tablet Commonly known as:  ULTRAM Take 1-2 tablets (50-100 mg total) by mouth every 6 (six) hours as needed (mild pain).       Follow Up Appointments: Follow-up Information    Delight OvensEdward B Gerhardt, MD Follow up on 05/11/2016.   Specialty:  Cardiothoracic Surgery Why:  PA/LAT CXR to be taken ([facility] which is in the same building as Dr. Dennie MaizesGerhardt's office) on 05/11/2016 at 10:30 am;Appointment time is at 11:00 am Contact information: [address] [place] [address] [phone]        Nurse Follow up on 05/02/2016.   Why:  Appointment is with nurse only to have chest tube suture removal. Appointment time is at 10:00 am Contact information: [address] [place] [address]          Signed: Doree FudgeZIMMERMAN,DONIELLE MPA-C 04/21/2016, 1:19 PM

## 2016-04-20 NOTE — Discharge Instructions (Signed)
Thoracoscopy, Care After ° °Refer to this sheet in the next few weeks. These instructions provide you with information about caring for yourself after your procedure. Your health care provider may also give you more specific instructions. Your treatment has been planned according to current medical practices, but problems sometimes occur. Call your health care provider if you have any problems or questions after your procedure. °What can I expect after the procedure? °After your procedure, it is common to feel sore for up to two weeks. °Follow these instructions at home: °· There are many different ways to close and cover an incision, including stitches (sutures), skin glue, and adhesive strips. Follow your health care provider's instructions about: °¨ Incision care. °¨ Bandage (dressing) changes and removal. °¨ Incision closure removal. °· Check your incision area every day for signs of infection. Watch for: °¨ Redness, swelling, or pain. °¨ Fluid, blood, or pus. °· Take medicines only as directed by your health care provider. °· Try to cough often. Coughing helps to protect against lung infection (pneumonia). It may hurt to cough. If this happens, hold a pillow against your chest when you cough. °· Take deep breaths. This also helps to protect against pneumonia. °· If you were given an incentive spirometer, use it as directed by your health care provider. °· Do not take baths, swim, or use a hot tub until your health care provider approves. You may take showers. °· Avoid lifting until your health care provider approves. °· Avoid driving until your health care provider approves. °· Do not travel by airplane after the chest tube is removed until your health care provider approves. °Contact a health care provider if: °· You have a fever. °· Pain medicines do not ease your pain. °· You have redness, swelling, or increasing pain in your incision area. °· You develop a cough that does not go away, or you are coughing up  mucus that is yellow or green. °Get help right away if: °· You have fluid, blood, or pus coming from your incision. °· There is a bad smell coming from your incision or dressing. °· You develop a rash. °· You have difficulty breathing. °· You cough up blood. °· You develop light-headedness or you feel faint. °· You develop chest pain. °· Your heartbeat feels irregular or very fast. ° °This information is not intended to replace advice given to you by your health care provider. Make sure you discuss any questions you have with your health care provider. °Document Released: 10/14/2004 Document Revised: 11/28/2015 Document Reviewed: 12/10/2013 °Elsevier Interactive Patient Education © 2017 Elsevier Inc. ° °

## 2016-04-20 NOTE — Progress Notes (Addendum)
      [address]       [place] [id number]             [phone]       2 Days Post-Op Procedure(s) (LRB): VIDEO ASSISTED THORACOSCOPY (Left) STAPLING OF BLEBS (Left) VIDEO BRONCHOSCOPY (N/A) Mechanical PLEURADESIS (Left)  Subjective: Patient without complaints this am  Objective: Vital signs in last 24 hours: Temp:  [98.4 F (36.9 C)-99.7 F (37.6 C)] 99.7 F (37.6 C) (01/11 0508) Pulse Rate:  [60-87] 71 (01/11 0730) Cardiac Rhythm: Normal sinus rhythm (01/11 0730) Resp:  [15-26] 16 (01/11 0730) BP: (119-131)/(63-81) 119/69 (01/11 0730) SpO2:  [99 %-100 %] 99 % (01/11 0730)     Intake/Output from previous day: 01/10 0701 - 01/11 0700 In: 2611.5 [P.O.:960; I.V.:1651.5] Out: 2740 [Urine:2550; Chest Tube:190]   Physical Exam:  Cardiovascular: RRR. Pulmonary: Clear to auscultation bilaterally. Extremities: SCD in place Wounds: Clean and dry.  No erythema or signs of infection. Chest Tube: to water seal, no air leak  Lab Results: CBC: Recent Labs  04/19/16 0837 04/20/16 0209  WBC 8.9 8.1  HGB 12.9* 12.9*  HCT 40.0 39.8  PLT 246 218   BMET:  Recent Labs  04/19/16 0630 04/20/16 0209  NA 137 137  K 4.2 4.3  CL 103 99*  CO2 27 29  GLUCOSE 107* 97  BUN 6 9  CREATININE 1.03 1.10  CALCIUM 9.5 9.7    PT/INR:  Recent Labs  04/18/16 0609  LABPROT 14.3  INR 1.11   ABG:  INR: Will add last result for INR, ABG once components are confirmed Will add last 4 CBG results once components are confirmed  Assessment/Plan:  1. CV - SR 2.  Pulmonary - On room air. Chest tube with 190 cc output last 24 hours. Chest tube is to water seal and there is no air leak. CXR this am shows no pneumothorax. Hope to remove chest tube today. Encourage incentive spirometer. 3. Stop PCA after chest tube removed. 4. Possible discharge in am if CXR remains stable  ZIMMERMAN,DONIELLE MPA-C 04/20/2016,7:53 AM  No air leak , d/c chest tube this am I  have seen and examined Karen ChafeMarvin Janes and agree with the above assessment  and plan.  Delight OvensEdward B Char Feltman MD [place] [phone] Office [phone] 04/20/2016 8:26 AM

## 2016-04-21 ENCOUNTER — Institutional Professional Consult (permissible substitution): Payer: Self-pay | Admitting: Pulmonary Disease

## 2016-04-21 ENCOUNTER — Inpatient Hospital Stay (HOSPITAL_COMMUNITY): Payer: BLUE CROSS/BLUE SHIELD

## 2016-04-21 MED ORDER — TRAMADOL HCL 50 MG PO TABS: 50.0000 mg | ORAL_TABLET | Freq: Four times a day (QID) | ORAL | 0 refills | Status: DC | PRN

## 2016-04-21 MED ORDER — ACETAMINOPHEN 500 MG PO TABS: 1000.0000 mg | ORAL_TABLET | Freq: Four times a day (QID) | ORAL | 0 refills | Status: AC | PRN

## 2016-04-21 NOTE — Care Management Note (Signed)
Case Management Note  Patient Details  Name: [name] [name] MRN: [id number] Date of Birth: 1990-12-16  Subjective/Objective:   S/p VATS, stapling of Blebs, for dc today, no needs.                 Action/Plan:   Expected Discharge Date:  04/21/16               Expected Discharge Plan:  Home/Self Care  In-House Referral:     Discharge planning Services  CM Consult  Post Acute Care Choice:    Choice offered to:     DME Arranged:    DME Agency:     HH Arranged:    HH Agency:     Status of Service:  Completed, signed off  If discussed at [employer] Length of Stay Meetings, dates discussed:    Additional Comments:  Leone Havenaylor, Giovanny Dugal Clinton, RN 04/21/2016, 3:21 PM

## 2016-04-21 NOTE — Progress Notes (Signed)
Patient discharged to home. CCMD notified. PIV x1 removed per protocol. All belongings returned. Discharge instructions reviewed with patient including s/s infection and post op care for his surgical incisions. Follow up appts have already been set up and were reviewed. Prescriptions sent with patient.   [name] [name], Mallori Araque Eileen, RN.

## 2016-04-21 NOTE — Progress Notes (Addendum)
      [address]       [place] [id number]             [phone]      3 Days Post-Op Procedure(s) (LRB): VIDEO ASSISTED THORACOSCOPY (Left) STAPLING OF BLEBS (Left) VIDEO BRONCHOSCOPY (N/A) Mechanical PLEURADESIS (Left)   Subjective:  No complaints.  Wants to go home.  + BM  Objective: Vital signs in last 24 hours: Temp:  [98.6 F (37 C)-100.1 F (37.8 C)] 99.9 F (37.7 C) (01/12 0700) Pulse Rate:  [82-103] 103 (01/12 0700) Cardiac Rhythm: Normal sinus rhythm (01/11 2000) Resp:  [14-27] 27 (01/12 0700) BP: (110-126)/(73-80) 123/73 (01/12 0700) SpO2:  [97 %-100 %] 99 % (01/12 0700)  Intake/Output from previous day: 01/11 0701 - 01/12 0700 In: 560 [P.O.:560] Out: 1375 [Urine:1375] Intake/Output this shift: Total I/O In: 360 [P.O.:360] Out: 0   General appearance: alert, cooperative and no distress Heart: regular rate and rhythm Lungs: clear to auscultation bilaterally Abdomen: soft, non-tender; bowel sounds normal; no masses,  no organomegaly Extremities: extremities normal, atraumatic, no cyanosis or edema Wound: clean and dry  Lab Results:  Recent Labs  04/19/16 0837 04/20/16 0209  WBC 8.9 8.1  HGB 12.9* 12.9*  HCT 40.0 39.8  PLT 246 218   BMET:  Recent Labs  04/19/16 0630 04/20/16 0209  NA 137 137  K 4.2 4.3  CL 103 99*  CO2 27 29  GLUCOSE 107* 97  BUN 6 9  CREATININE 1.03 1.10  CALCIUM 9.5 9.7    PT/INR: No results for input(s): LABPROT, INR in the last 72 hours. ABG    Component Value Date/Time   PHART 7.390 04/19/2016 0422   HCO3 28.4 (H) 04/19/2016 0422   O2SAT 99.0 04/19/2016 0422   CBG (last 3)   Recent Labs  04/20/16 0731 04/20/16 1141 04/20/16 1651  GLUCAP 146* 115* 142*    Assessment/Plan: S/P Procedure(s) (LRB): VIDEO ASSISTED THORACOSCOPY (Left) STAPLING OF BLEBS (Left) VIDEO BRONCHOSCOPY (N/A) Mechanical PLEURADESIS (Left)  1. Pulm- chest tube removed yesterday, CXR shows minimal 5% apical  pneumothorax 2. CV- Hemodynamically stable 3. Dispo- patient stable, minimal 5% apical pneumothorax on CXR... Patient wants to go home today, will review CXR with Dr. Tyrone SageGerhardt possibly home later today vs. Tomorrow AM   LOS: 3 days    Lowella DandyBARRETT, ERIN 04/21/2016  Films reviewed , not sure has any PTX  Films reviewed , stable plan d/c today

## 2016-04-24 ENCOUNTER — Institutional Professional Consult (permissible substitution): Payer: Self-pay | Admitting: Pulmonary Disease

## 2016-05-03 ENCOUNTER — Encounter (INDEPENDENT_AMBULATORY_CARE_PROVIDER_SITE_OTHER): Payer: Self-pay

## 2016-05-03 DIAGNOSIS — Z4802 Encounter for removal of sutures: Secondary | ICD-10-CM

## 2016-05-03 DIAGNOSIS — J9383 Other pneumothorax: Secondary | ICD-10-CM

## 2016-05-03 DIAGNOSIS — Z978 Presence of other specified devices: Secondary | ICD-10-CM

## 2016-05-11 ENCOUNTER — Encounter: Payer: Self-pay | Admitting: Cardiothoracic Surgery

## 2016-05-11 ENCOUNTER — Encounter: Payer: BLUE CROSS/BLUE SHIELD | Admitting: Cardiothoracic Surgery

## 2016-05-12 ENCOUNTER — Other Ambulatory Visit: Payer: Self-pay | Admitting: Cardiothoracic Surgery

## 2016-05-12 DIAGNOSIS — J439 Emphysema, unspecified: Secondary | ICD-10-CM

## 2016-05-15 ENCOUNTER — Ambulatory Visit (INDEPENDENT_AMBULATORY_CARE_PROVIDER_SITE_OTHER): Payer: Self-pay | Admitting: Physician Assistant

## 2016-05-15 ENCOUNTER — Ambulatory Visit
Admission: RE | Admit: 2016-05-15 | Discharge: 2016-05-15 | Disposition: A | Discharge status: Home / Self Care | Payer: BLUE CROSS/BLUE SHIELD | Source: Ambulatory Visit | Attending: Cardiothoracic Surgery | Admitting: Cardiothoracic Surgery

## 2016-05-15 VITALS — BP 94/64 | HR 56 | Resp 20 | Ht 72.0 in | Wt 133.0 lb

## 2016-05-15 DIAGNOSIS — J9383 Other pneumothorax: Secondary | ICD-10-CM

## 2016-05-15 DIAGNOSIS — J939 Pneumothorax, unspecified: Secondary | ICD-10-CM

## 2016-05-15 DIAGNOSIS — J439 Emphysema, unspecified: Secondary | ICD-10-CM

## 2016-05-15 NOTE — Progress Notes (Signed)
HPI: Patient returns for routine postoperative follow-up having undergone Left VATS 04/20/2016 with stapling of blebs and mechanical pleurodesis on 04/20/2016. The patient's early postoperative recovery while in the hospital was unremarkable. Since hospital discharge the patient reports he is doing well for the most part.  He states he gets some discomfort with deep inspiration when the temperature is cold.  He also has some pain below his left breast.  He denies shortness of breath.    Current Outpatient Prescriptions  Medication Sig Dispense Refill  . acetaminophen (TYLENOL) 500 MG tablet Take 2 tablets (1,000 mg total) by mouth every 6 (six) hours as needed for mild pain or fever. (Patient not taking: Reported on 05/15/2016) 30 tablet 0  . traMADol (ULTRAM) 50 MG tablet Take 1-2 tablets (50-100 mg total) by mouth every 6 (six) hours as needed (mild pain). (Patient not taking: Reported on 05/15/2016) 30 tablet 0   No current facility-administered medications for this visit.     Physical Exam:  BP 94/64   Pulse (!) 56   Resp 20   Ht 6' (1.829 m)   Wt 133 lb (60.3 kg)   BMI 18.04 kg/m   Gen: no apparent distress, looks great Heart: RRR Lungs: CTA bilaterally Incisions: well healed  Diagnostic Tests:  CXR: no pneumothorax on left, ? Apical pneumothorax on right  A/P  1. S/P VAT with stapling of blebs/decortication on left- CXR looks good, incisions healing well 2. ? Right Pneumothorax 3. RTC in 3 weeks with repeat    Visit was performed by Wells GuilesKelly Rayburn PA-S  I agree with above  Lowella DandyBARRETT, Stormee Duda, PA-C [facility] [phone]

## 2016-05-25 ENCOUNTER — Inpatient Hospital Stay (HOSPITAL_COMMUNITY): Payer: BLUE CROSS/BLUE SHIELD

## 2016-05-25 ENCOUNTER — Other Ambulatory Visit: Payer: Self-pay

## 2016-05-25 ENCOUNTER — Ambulatory Visit (INDEPENDENT_AMBULATORY_CARE_PROVIDER_SITE_OTHER): Payer: Self-pay | Admitting: Thoracic Surgery (Cardiothoracic Vascular Surgery)

## 2016-05-25 ENCOUNTER — Telehealth: Payer: Self-pay

## 2016-05-25 ENCOUNTER — Inpatient Hospital Stay (HOSPITAL_COMMUNITY)
Admission: AD | Admit: 2016-05-25 | Discharge: 2016-05-30 | DRG: 200 | Disposition: A | Discharge status: Home / Self Care | Payer: BLUE CROSS/BLUE SHIELD | Source: Ambulatory Visit | Attending: Cardiothoracic Surgery | Admitting: Cardiothoracic Surgery

## 2016-05-25 ENCOUNTER — Ambulatory Visit
Admission: RE | Admit: 2016-05-25 | Discharge: 2016-05-25 | Disposition: A | Discharge status: Home / Self Care | Payer: BLUE CROSS/BLUE SHIELD | Source: Ambulatory Visit | Attending: Cardiothoracic Surgery | Admitting: Cardiothoracic Surgery

## 2016-05-25 VITALS — BP 124/84 | HR 80 | Resp 20 | Ht 72.0 in | Wt 133.0 lb

## 2016-05-25 DIAGNOSIS — Z09 Encounter for follow-up examination after completed treatment for conditions other than malignant neoplasm: Secondary | ICD-10-CM

## 2016-05-25 DIAGNOSIS — J9311 Primary spontaneous pneumothorax: Secondary | ICD-10-CM

## 2016-05-25 DIAGNOSIS — D649 Anemia, unspecified: Secondary | ICD-10-CM | POA: Diagnosis present

## 2016-05-25 DIAGNOSIS — J939 Pneumothorax, unspecified: Secondary | ICD-10-CM

## 2016-05-25 DIAGNOSIS — R091 Pleurisy: Secondary | ICD-10-CM

## 2016-05-25 DIAGNOSIS — Z9689 Presence of other specified functional implants: Secondary | ICD-10-CM

## 2016-05-25 DIAGNOSIS — Z87891 Personal history of nicotine dependence: Secondary | ICD-10-CM

## 2016-05-25 DIAGNOSIS — R001 Bradycardia, unspecified: Secondary | ICD-10-CM | POA: Diagnosis present

## 2016-05-25 DIAGNOSIS — J9383 Other pneumothorax: Secondary | ICD-10-CM

## 2016-05-25 DIAGNOSIS — J948 Other specified pleural conditions: Secondary | ICD-10-CM | POA: Diagnosis not present

## 2016-05-25 LAB — BASIC METABOLIC PANEL
ANION GAP: 10 (ref 5–15)
BUN: 14 mg/dL (ref 6–20)
CALCIUM: 9.8 mg/dL (ref 8.9–10.3)
CHLORIDE: 100 mmol/L — AB (ref 101–111)
CO2: 30 mmol/L (ref 22–32)
Creatinine, Ser: 1.07 mg/dL (ref 0.61–1.24)
GFR calc Af Amer: >60 mL/min (ref >60)
GFR calc non Af Amer: >60 mL/min (ref >60)
Glucose, Bld: 101 mg/dL — ABNORMAL HIGH (ref 65–99)
Potassium: 3.4 mmol/L — ABNORMAL LOW (ref 3.5–5.1)
Sodium: 140 mmol/L (ref 135–145)

## 2016-05-25 MED ORDER — SODIUM CHLORIDE 0.9 % IV SOLN: 250.0000 mL | INTRAVENOUS | Status: DC | PRN

## 2016-05-25 MED ORDER — OXYCODONE HCL 5 MG PO TABS
5.0000 mg | ORAL_TABLET | ORAL | Status: DC | PRN
  Administered 2016-05-25 – 2016-05-29 (×11): 5 mg via ORAL
  Filled 2016-05-25 (×12): qty 1

## 2016-05-25 MED ORDER — KETOROLAC TROMETHAMINE 15 MG/ML IJ SOLN: 15.0000 mg | Freq: Four times a day (QID) | INTRAMUSCULAR | Status: DC | PRN

## 2016-05-25 MED ORDER — MIDAZOLAM HCL 2 MG/2ML IJ SOLN
2.0000 mg | Freq: Once | INTRAMUSCULAR | Status: AC
  Administered 2016-05-25: 2 mg via INTRAVENOUS
  Filled 2016-05-25: qty 2

## 2016-05-25 MED ORDER — SODIUM CHLORIDE 0.9% FLUSH
3.0000 mL | Freq: Two times a day (BID) | INTRAVENOUS | Status: DC
  Administered 2016-05-25 – 2016-05-29 (×3): 3 mL via INTRAVENOUS

## 2016-05-25 MED ORDER — MORPHINE SULFATE (PF) 2 MG/ML IV SOLN
2.0000 mg | Freq: Once | INTRAVENOUS | Status: AC
  Administered 2016-05-25: 2 mg via INTRAVENOUS
  Filled 2016-05-25: qty 1

## 2016-05-25 MED ORDER — ACETAMINOPHEN 650 MG RE SUPP: 650.0000 mg | Freq: Four times a day (QID) | RECTAL | Status: DC | PRN

## 2016-05-25 MED ORDER — ONDANSETRON HCL 4 MG/2ML IJ SOLN: 4.0000 mg | Freq: Four times a day (QID) | INTRAMUSCULAR | Status: DC | PRN

## 2016-05-25 MED ORDER — ENOXAPARIN SODIUM 40 MG/0.4ML ~~LOC~~ SOLN
40.0000 mg | SUBCUTANEOUS | Status: DC
  Administered 2016-05-26 – 2016-05-29 (×4): 40 mg via SUBCUTANEOUS
  Filled 2016-05-25 (×6): qty 0.4

## 2016-05-25 MED ORDER — MORPHINE SULFATE (PF) 2 MG/ML IV SOLN: 2.0000 mg | Freq: Once | INTRAVENOUS | Status: DC

## 2016-05-25 MED ORDER — ONDANSETRON HCL 4 MG PO TABS: 4.0000 mg | ORAL_TABLET | Freq: Four times a day (QID) | ORAL | Status: DC | PRN

## 2016-05-25 MED ORDER — MIDAZOLAM HCL 2 MG/2ML IJ SOLN: 2.0000 mg | Freq: Once | INTRAMUSCULAR | Status: DC

## 2016-05-25 MED ORDER — LIDOCAINE HCL (PF) 1 % IJ SOLN
30.0000 mL | Freq: Once | INTRAMUSCULAR | Status: AC
  Administered 2016-05-25: 30 mL
  Filled 2016-05-25: qty 30

## 2016-05-25 MED ORDER — ACETAMINOPHEN 325 MG PO TABS
650.0000 mg | ORAL_TABLET | Freq: Four times a day (QID) | ORAL | Status: DC | PRN
  Administered 2016-05-25 – 2016-05-29 (×6): 650 mg via ORAL
  Filled 2016-05-25 (×7): qty 2

## 2016-05-25 MED ORDER — ENOXAPARIN SODIUM 40 MG/0.4ML ~~LOC~~ SOLN: 40.0000 mg | SUBCUTANEOUS | Status: DC

## 2016-05-25 MED ORDER — SODIUM CHLORIDE 0.9% FLUSH
3.0000 mL | Freq: Two times a day (BID) | INTRAVENOUS | Status: DC
  Administered 2016-05-25 – 2016-05-29 (×10): 3 mL via INTRAVENOUS

## 2016-05-25 MED ORDER — ACETAMINOPHEN 325 MG PO TABS
650.0000 mg | ORAL_TABLET | Freq: Four times a day (QID) | ORAL | Status: DC | PRN
  Filled 2016-05-25: qty 2

## 2016-05-25 MED ORDER — SENNOSIDES-DOCUSATE SODIUM 8.6-50 MG PO TABS: 1.0000 | ORAL_TABLET | Freq: Every evening | ORAL | Status: DC | PRN

## 2016-05-25 MED ORDER — SODIUM CHLORIDE 0.9% FLUSH: 3.0000 mL | INTRAVENOUS | Status: DC | PRN

## 2016-05-25 NOTE — Progress Notes (Signed)
Patient arrived on the unit from home on wheelchair, assessment completed see flowsheet, placed on tele ccdm notified,patient oriented to room and staff, bed in lowest position, call bell within reach will continue to monitor

## 2016-05-25 NOTE — Telephone Encounter (Signed)
Patient call c/o left shoulder pain with breathing, started last night and seems to be getting worse. He is scheduled for return appt on 06/04/16. I recommended that he come in today with a CXR to see a PA for eval. His appointment is @ 1430.

## 2016-05-25 NOTE — H&P (Signed)
Subjective:   Patient is a 26 y.o. male well known to [facility].  He is S/P Left VATS with Stapling of left apical blebs and mechanical pleurodesis.  This was done by Dr. Tyrone Sage on 04/18/2016.  He was last evaluated int he office on 05/15/2016 at which time the patient was doing well.  He did have some residual neuropathic pain along his left breast.  He contacted our office this morning with complaints of chest pain and shortness of breath which developed last night.  He was instructed to come to the office for a chest xray.  This was obtained and showed a recurrent left sided pneumothorax.  Due to the recurrence of pneumothorax he was sent to the hospital for admission.  Currently the patient currently complains of some mild chest discomfort but denies shortness of breath.  Patient Active Problem List   Diagnosis Date Noted  . Bleb, lung (HCC) 04/18/2016  . Pneumothorax on left 04/14/2016  . Pneumothorax, left   . Recurrent spontaneous pneumothorax 11/02/2015  . S/P chest tube placement [facility])    Past Medical History:  Diagnosis Date  . Bradycardia, sinus    States he's been told it's an athletic heart rate  . Spontaneous pneumothorax 04/13/2016   previous pneumothorax  in 10/2015    Past Surgical History:  Procedure Laterality Date  . CHEST TUBE INSERTION Left 10/2015   also on 04/01/16  . PLEURADESIS Left 04/18/2016   Procedure: Mechanical PLEURADESIS;  Surgeon: [name], MD;  Location: [facility] OR;  Service: Thoracic;  Laterality: Left;  . STAPLING OF BLEBS Left 04/18/2016   Procedure: STAPLING OF BLEBS;  Surgeon: [name], MD;  Location: [facility] OR;  Service: Thoracic;  Laterality: Left;  Marland Kitchen VIDEO ASSISTED THORACOSCOPY Left 04/18/2016   Procedure: VIDEO ASSISTED THORACOSCOPY;  Surgeon: [name], MD;  [facility];  Service: Thoracic;  Laterality: Left;  Marland Kitchen VIDEO BRONCHOSCOPY N/A 04/18/2016   Procedure: VIDEO BRONCHOSCOPY;  Surgeon: [name], MD;  Location: [facility] OR;   Service: Thoracic;  Laterality: N/A;    No prescriptions prior to admission.   Allergies  Allergen Reactions  . No Known Allergies     Social History  Substance Use Topics  . Smoking status: Former Smoker    Types: Cigarettes    Quit date: 01/16/2016  . Smokeless tobacco: Never Used     Comment: ONLY SMOKED EVERY NOW AND THEN  . Alcohol use No     Comment: occasional    No family history on file.   Review of Systems Constitutional: negative for chills, fevers and sweats Ears, nose, mouth, throat, and face: negative Respiratory: positive for H/O Pneumothorax Cardiovascular: positive for chest pain Gastrointestinal: negative Neurological: negative  Objective:   General appearance: alert, cooperative and no distress Head: Normocephalic, without obvious abnormality, atraumatic Eyes: conjunctivae/corneas clear. PERRL, EOM's intact. Fundi benign. Lungs: clear to auscultation bilaterally Chest wall: previous VATS incision on left chest Heart: regular rate and rhythm Abdomen: soft, non-tender; bowel sounds normal; no masses,  no organomegaly Extremities: extremities normal, atraumatic, no cyanosis or edema Neurologic: Grossly normal  Data Review: Will obtain BMET  Chest X-Ray: left pneumothorax   Assessment:   Patient is a 26 yo AA male S/P LeftVATS with stapling of blebs and pleurodesis done on 04/18/2016 by Dr. Tyrone Sage.  Presented to office today with complaints of chest pain and shortness of breath.  CXR obtained and shows re-development of left sided pneumothorax.  Patient is stable  Plan:  1. Recurrent Spontaneous Pneumothorax- will require chest tube placement 2. Admit patient to inpatient 3. Pain control- will order Toradol 15 mg Q 6 prn, Oxy IR 5 mg Q4 prn 4. Dispo- Dr. Cornelius Moraswen has reviewed patient's CXR in the office.  He will place a left sided chest tube   I have seen and examined the patient and agree with the assessment and plan as outlined.  Purcell Nailslarence H  Diavion Labrador, MD 05/25/2016 4:57 PM

## 2016-05-25 NOTE — Progress Notes (Signed)
[address]       [place] [address]             [phone]     CARDIOTHORACIC SURGERY OFFICE NOTE  Referring Provider is Kalman Shan, MD PCP is No PCP Per Patient   HPI:  Patient is a 26 year old African-American male with history of recurrent left spontaneous pneumothorax who underwent left video-assisted thoracoscopy for stapling of apical blebs with mechanical pleurodesis by Dr. Tyrone Sage on 04/18/2016. The patient's postoperative recovery was uneventful. He was last seen here in our office on 05/15/2016 at which time he was doing well. Chest x-ray at that time looked clear and there was no pneumothorax. The patient states that yesterday evening at approximately 6:30 while he was at work he began to experience left-sided pleuritic chest discomfort without shortness of breath. Symptoms increased somewhat in severity and persisted overnight. He called our office and came in for follow-up evaluation. He denies any shortness of breath. He reports moderate pain across the left posterior chest and shoulder which is similar to the pain he experienced previously. Pain is made worse by deep breath. He states that he has been careful not to do any sort of heavy lifting since he went back to work and school. He reports no other problems or complaints.   Current Outpatient Prescriptions  Medication Sig Dispense Refill  . acetaminophen (TYLENOL) 500 MG tablet Take 2 tablets (1,000 mg total) by mouth every 6 (six) hours as needed for mild pain or fever. 30 tablet 0  . traMADol (ULTRAM) 50 MG tablet Take 1-2 tablets (50-100 mg total) by mouth every 6 (six) hours as needed (mild pain). 30 tablet 0   No current facility-administered medications for this visit.    Facility-Administered Medications Ordered in Other Visits  Medication Dose Route Frequency Provider Last Rate Last Dose  . 0.9 %  sodium chloride infusion  250 mL Intravenous PRN Erin R Barrett, PA-C      .  acetaminophen (TYLENOL) tablet 650 mg  650 mg Oral Q6H PRN Erin R Barrett, PA-C       Or  . acetaminophen (TYLENOL) suppository 650 mg  650 mg Rectal Q6H PRN Erin R Barrett, PA-C      . enoxaparin (LOVENOX) injection 40 mg  40 mg Subcutaneous Q24H Erin R Barrett, PA-C      . ketorolac (TORADOL) 15 MG/ML injection 15 mg  15 mg Intravenous Q6H PRN Erin R Barrett, PA-C      . midazolam (VERSED) injection 2 mg  2 mg Intravenous Once Erin R Barrett, PA-C      . morphine 2 MG/ML injection 2 mg  2 mg Intravenous Once Erin R Barrett, PA-C      . ondansetron (ZOFRAN) tablet 4 mg  4 mg Oral Q6H PRN Erin R Barrett, PA-C       Or  . ondansetron (ZOFRAN) injection 4 mg  4 mg Intravenous Q6H PRN Erin R Barrett, PA-C      . oxyCODONE (Oxy IR/ROXICODONE) immediate release tablet 5 mg  5 mg Oral Q4H PRN Erin R Barrett, PA-C      . senna-docusate (Senokot-S) tablet 1 tablet  1 tablet Oral QHS PRN Erin R Barrett, PA-C      . sodium chloride flush (NS) 0.9 % injection 3 mL  3 mL Intravenous Q12H Erin R Barrett, PA-C      . sodium chloride flush (NS) 0.9 % injection 3 mL  3 mL  Intravenous Q12H Erin R Barrett, PA-C      . sodium chloride flush (NS) 0.9 % injection 3 mL  3 mL Intravenous PRN Rae RoamErin R Barrett, PA-C          Physical Exam:   BP 124/84   Pulse 80   Resp 20   Ht 6' (1.829 m)   Wt 133 lb (60.3 kg)   SpO2 99% Comment: RA  BMI 18.04 kg/m   General:  Well-appearing  Chest:   Clear but somewhat diminished breath sounds on the left side  CV:   Regular rate and rhythm  Incisions:  Well-healed  Abdomen:  Soft nontender  Extremities:  Warm and well-perfused  Diagnostic Tests:  CHEST  2 VIEW  COMPARISON:  PA and lateral chest 05/15/2016.  FINDINGS: The patient has a left hydropneumothorax. Pneumothorax is estimated at 30%. Pleural effusion is small. Right lung is expanded and clear. No bony abnormality.  IMPRESSION: Left hydropneumothorax with a small effusion and pneumothorax estimated  at 30%.  Critical Value/emergent results were called by telephone at the time of interpretation on 05/25/2016 at 1:53 pm to Shari HeritageAllison Tilley, R.N. who verbally acknowledged these results.   Electronically Signed   By: Drusilla Kannerhomas  Dalessio M.D.   On: 05/25/2016 13:55   Impression:  Patient has developed another recurrent left spontaneous pneumothorax approximately one month after having undergone left video-assisted thoracoscopy for apical stapling of blebs with mechanical pleurodesis. The lung is at least 30% collapsed. The patient is not short of breath.  Plan:  I discussed options with the patient in the office today. We discussed continued observation versus proceeding with replacement of chest tube. Given the size of the pneumothorax of feel that chest tube replacement as the best initial course of therapy. He understands the nature of this problem, the indications, risks, and potential benefits of chest tube placement, and desires to be admitted directly to the hospital this afternoon for further management. All of his questions been addressed.     Salvatore Decentlarence H. Cornelius Moraswen, MD 05/25/2016 3:08 PM

## 2016-05-25 NOTE — Op Note (Signed)
CARDIOTHORACIC SURGERY OPERATIVE NOTE  Date of Procedure:  05/25/2016  Preoperative Diagnosis: Recurrent Left Spontaneous Pneumothorax  Postoperative Diagnosis: Same  Procedure:   Left chest tube placement  Surgeon:   Salvatore Decentlarence H. Cornelius Moraswen, MD  Anesthesia: 1% lidocaine local with intravenous sedation    DETAILS OF THE OPERATIVE PROCEDURE  Following full informed consent the patient was given morphine 2 mg and midazolam 2 mg intravenously and continuously monitored for rhythm, BP and oxygen saturation. The left chest was prepared and draped in a sterile manner. 1% lidocaine was utilized to anesthetize the skin and subcutaneous tissues. A small incision was made and a 28 French straight chest tube was placed through the incision into the pleural space. The tube was secured to the skin and connected to a closed suction collection device. The patient tolerated the procedure well. A portable CXR was ordered. There were no complications.    Salvatore Decentlarence H. Cornelius Moraswen, MD 05/25/2016 5:41 PM

## 2016-05-25 NOTE — Patient Instructions (Signed)
Go directly to the hospital for admission 

## 2016-05-26 ENCOUNTER — Inpatient Hospital Stay (HOSPITAL_COMMUNITY): Payer: BLUE CROSS/BLUE SHIELD

## 2016-05-26 ENCOUNTER — Encounter (HOSPITAL_COMMUNITY): Payer: Self-pay | Admitting: Nurse Practitioner

## 2016-05-26 DIAGNOSIS — J9383 Other pneumothorax: Secondary | ICD-10-CM | POA: Diagnosis present

## 2016-05-26 DIAGNOSIS — R0602 Shortness of breath: Secondary | ICD-10-CM | POA: Diagnosis present

## 2016-05-26 DIAGNOSIS — R001 Bradycardia, unspecified: Secondary | ICD-10-CM | POA: Diagnosis present

## 2016-05-26 DIAGNOSIS — D649 Anemia, unspecified: Secondary | ICD-10-CM | POA: Diagnosis present

## 2016-05-26 DIAGNOSIS — J948 Other specified pleural conditions: Secondary | ICD-10-CM | POA: Diagnosis not present

## 2016-05-26 DIAGNOSIS — Z87891 Personal history of nicotine dependence: Secondary | ICD-10-CM | POA: Diagnosis not present

## 2016-05-26 MED ORDER — KETOROLAC TROMETHAMINE 15 MG/ML IJ SOLN
15.0000 mg | Freq: Once | INTRAMUSCULAR | Status: AC
  Administered 2016-05-26: 15 mg via INTRAVENOUS
  Filled 2016-05-26: qty 1

## 2016-05-26 MED ORDER — POTASSIUM CHLORIDE CRYS ER 20 MEQ PO TBCR
40.0000 meq | EXTENDED_RELEASE_TABLET | Freq: Once | ORAL | Status: AC
  Administered 2016-05-26: 40 meq via ORAL
  Filled 2016-05-26: qty 2

## 2016-05-26 MED ORDER — ONDANSETRON 4 MG PO TBDP
ORAL_TABLET | ORAL | Status: AC
  Administered 2016-05-26: 4 mg
  Filled 2016-05-26: qty 1

## 2016-05-26 NOTE — Discharge Instructions (Signed)
Pneumothorax °Introduction °A pneumothorax, commonly called a collapsed lung, is a condition in which air leaks from a lung and builds up in the space between the lung and the chest wall (pleural space). The air in a pneumothorax is trapped outside the lung and takes up space, preventing the lung from fully expanding. This is a condition that usually occurs suddenly. The buildup of air may be small or large. A small pneumothorax may go away on its own. When a pneumothorax is larger, it will often require medical treatment and hospitalization. °What are the causes? °A pneumothorax can sometimes happen quickly with no apparent cause. People with underlying lung problems, particularly COPD or emphysema, are at higher risk of pneumothorax. However, pneumothorax can happen quickly even in people with no prior known lung problems. Trauma, surgery, medical procedures, or injury to the chest wall can also cause a pneumothorax. °What are the signs or symptoms? °Sometimes a pneumothorax will have no symptoms. When symptoms are present, they can include: °· Chest pain. °· Shortness of breath. °· Increased rate of breathing. °· Bluish color to your lips or skin (cyanosis). °How is this diagnosed? °Pneumothorax is usually diagnosed by a chest X-ray or chest CT scan. Your health care provider will also take a medical history and perform a physical exam to determine why you may have a pneumothorax. °How is this treated? °A small pneumothorax may go away on its own without treatment. Extra oxygen can sometimes help a small pneumothorax go away more quickly. For a larger pneumothorax or a pneumothorax that is causing symptoms, a procedure is usually needed to drain the air. In some cases, the health care provider may drain the air using a needle. In other cases, a chest tube may be inserted into the pleural space. A chest tube is a small tube placed between the ribs and into the pleural space. This removes the extra air and allows  the lung to expand back to its normal size. A large pneumothorax will usually require a hospital stay. If there is ongoing air leakage into the pleural space, then the chest tube may need to remain in place for several days until the air leak has healed. In some cases, surgery may be needed. °Follow these instructions at home: °· Only take over-the-counter or prescription medicines as directed by your health care provider. °· If a cough or pain makes it difficult for you to sleep at night, try sleeping in a semi-upright position in a recliner or by using 2 or 3 pillows. °· Rest and limit activity as directed by your health care provider. °· If you had a chest tube and it was removed, ask your health care provider when it is okay to remove the dressing. Until your health care provider says you can remove the dressing, do not allow it to get wet. °· Do not smoke. Smoking is a risk factor for pneumothorax. °· Do not fly in an airplane or scuba dive until your health care provider says it is okay. °· Follow up with your health care provider as directed. °Get help right away if: °· You have increasing chest pain or shortness of breath. °· You have a cough that is not controlled with suppressants. °· You begin coughing up blood. °· You have pain that is getting worse or is not controlled with medicines. °· You cough up thick, discolored mucus (sputum) that is yellow to green in color. °· You have redness, increasing pain, or discharge at the site where a   chest tube had been in place (if your pneumothorax was treated with a chest tube). °· The site where your chest tube was located opens up. °· You feel air coming out of the site where the chest tube was placed. °· You have a fever or persistent symptoms for more than 2-3 days. °· You have a fever and your symptoms suddenly get worse. °This information is not intended to replace advice given to you by your health care provider. Make sure you discuss any questions you have  with your health care provider. °Document Released: 03/27/2005 Document Revised: 09/02/2015 Document Reviewed: 08/20/2013 °© 2017 Elsevier ° °

## 2016-05-26 NOTE — Discharge Summary (Signed)
Physician Discharge Summary       [address]       [place] [id number]             [phone]    Patient ID: [name] ChafeMarvin [name] MRN: [id number] DOB/AGE: June 29, 1990 26 y.o.  Admit date: 05/25/2016 Discharge date: 05/30/2016  Admission Diagnoses: Recurrent left spontaneous pneumothorax  Active Diagnoses:  1.Bleb, lung (HCC) 2. Bradycardia, sinus 3. History of tobacco abuse  Procedure (s):  28 French left chest tube placed by Dr. Cornelius Moraswen on 05/25/2016  History of Presenting Illness: Patient is a 26 y.o. male well known to [facility].  He is S/P Left VATS with Stapling of left apical blebs and mechanical pleurodesis.  This was done by Dr. Tyrone SageGerhardt on 04/18/2016.  He was last evaluated int he office on 05/15/2016 at which time the patient was doing well.  He did have some residual neuropathic pain along his left breast.  He contacted our office this morning with complaints of chest pain and shortness of breath which developed last night.  He was instructed to come to the office for a chest xray.  This was obtained and showed a recurrent left sided pneumothorax.  Due to the recurrence of pneumothorax, he was sent to the hospital for admission.  Currently, the patient complains of some mild chest discomfort but denies shortness of breath. Dr. Cornelius Moraswen placed a 28 French left sided chest tube. Follow up chest x ray showed no residual pneumothorax.  Brief Hospital Course:  He has remained afebrile. Chest tube did not have an air leak. Daily chest x rays were obtained and remained stable. Chest tube was placed to water seal on 02/17 and was then removed on 02/19. Follow up chest x ray showed interval development of approximately 20% left-sided hydropneumothorax. He is ambulating on room air. As discussed with Dr. Tyrone SageGerhardt, he is felt surgically stable for discharge today. He was instructed NO strenuous activity and no flying. He will be seen in the office on Friday 02/23 with a CXR.   Latest  Vital Signs: Blood pressure 126/71, pulse 60, temperature 98 F (36.7 C), temperature source Oral, resp. rate 20, height 6' (1.829 m), weight 134 lb 7.7 oz (61 kg), SpO2 100 %.  Physical Exam: Cardiovascular: RRR Pulmonary: Clear to auscultation bilaterally; no rales, wheezes, or rhonchi. Chest Tube: to suction, no air leak  Discharge Condition:Stable and discharged to home.  Recent laboratory studies:  Lab Results  Component Value Date   WBC 6.7 05/30/2016   HGB 12.7 (L) 05/30/2016   HCT 39.6 05/30/2016   MCV 70.6 (L) 05/30/2016   PLT 261 05/30/2016   Lab Results  Component Value Date   NA 140 05/25/2016   K 3.4 (L) 05/25/2016   CL 100 (L) 05/25/2016   CO2 30 05/25/2016   CREATININE 1.07 05/25/2016   GLUCOSE 101 (H) 05/25/2016   Diagnostic Studies:  CLINICAL DATA:  26 year old male with spontaneous pneumothorax. Post treatment. Subsequent encounter.  EXAM: CHEST  2 VIEW  COMPARISON:  05/29/2016 chest x-ray.  04/23/2016 chest CT.  FINDINGS: Interval development of approximately 20% left-sided hydropneumothorax with air/fluid component most notable inferiorly. There may be minimal mediastinal deviation to the right.  No segmental consolidation or pulmonary edema. Heart size within normal limits. No acute osseous abnormality.  IMPRESSION: Interval development of approximately 20% left-sided hydropneumothorax with air/fluid component most notable inferiorly. There may be minimal mediastinal deviation to the right.  These results were called by telephone at the time  of interpretation on 05/30/2016 at 8:52 am to Valere Dross patient's nurse who verbally acknowledged these results. Request to call Dr. Orvan July service with results.   Electronically Signed   By: Lacy Duverney M.D.   On: 05/30/2016 08:55   Discharge Medications: Allergies as of 05/30/2016   No Known Allergies     Medication List    TAKE these medications   acetaminophen 500 MG  tablet Commonly known as:  TYLENOL Take 2 tablets (1,000 mg total) by mouth every 6 (six) hours as needed for mild pain or fever.   traMADol 50 MG tablet Commonly known as:  ULTRAM Take 1 tablet (50 mg total) by mouth every 6 (six) hours as needed for moderate pain (mild pain). What changed:  how much to take  reasons to take this       Follow Up Appointments: Follow-up Information    [name], MD Follow up on 06/02/2016.   Specialty:  Cardiothoracic Surgery Why:  PA/LAT CXR to be taken (at [facility] which is in the same building as Dr. Dennie Maizes office) on 06/02/2016 at 12:30 pm;Appointment time is at 1:00 pm Contact information: [address] [employer] [address] [place] [address] [phone]           Signed: Doree Fudge MPA-C 05/30/2016, 9:08 AM

## 2016-05-26 NOTE — Progress Notes (Addendum)
      [address]       [place] [id number]             [phone]           Subjective: Patient has pain at chest tube site  Objective: Vital signs in last 24 hours: Temp:  [98 F (36.7 C)-98.6 F (37 C)] 98 F (36.7 C) (02/16 0324) Pulse Rate:  [60-84] 60 (02/16 0324) Cardiac Rhythm: Normal sinus rhythm (02/15 1904) Resp:  [17-20] 17 (02/16 0324) BP: (112-140)/(67-92) 112/67 (02/16 0324) SpO2:  [99 %-100 %] 100 % (02/16 0324) Weight:  [133 lb (60.3 kg)-135 lb 14.4 oz (61.6 kg)] 135 lb 14.4 oz (61.6 kg) (02/15 1632)     Intake/Output from previous day: 02/15 0701 - 02/16 0700 In: 250 [P.O.:250] Out: 264 [Urine:225; Chest Tube:39]   Physical Exam:  Cardiovascular: RRR Pulmonary: Clear to auscultation bilaterally; no rales, wheezes, or rhonchi. Chest Tube: to suction, no air leak  Lab Results: CBC:No results for input(s): WBC, HGB, HCT, PLT in the last 72 hours. BMET:  Recent Labs  05/25/16 1646  NA 140  K 3.4*  CL 100*  CO2 30  GLUCOSE 101*  BUN 14  CREATININE 1.07  CALCIUM 9.8    PT/INR: No results for input(s): LABPROT, INR in the last 72 hours. ABG:  INR: Will add last result for INR, ABG once components are confirmed Will add last 4 CBG results once components are confirmed  Assessment/Plan:  1. CV - SR in the 60's (SB in the 40's at times but has a history). 2.  Pulmonary - S/p left chest tube placement yesterday. Chest tube is to suction. There is no air leak. CXR this am appears stable (no pneumothorax). Hope to place chest tube to water seal in am. Check CXR in am. 3. Will give a dose of Toradol this am to help with pain  ZIMMERMAN,DONIELLE MPA-C 05/26/2016,7:35 AM   I have seen and examined the patient and agree with the assessment and plan as outlined.  Leave tube on suction today.  Purcell Nailslarence H Aeryn Medici, MD 05/26/2016 8:36 AM

## 2016-05-26 NOTE — Progress Notes (Signed)
Clinical Social Worker received a verbal consult via phone from Raquel Sarna CSW from Heart Failure clinic. Ms. [name] [name] stated that patient is from the clinic and has been seen their in the past. Ms. [name] [name] stated the patient has some financial issues related to not being able to work due to patient being in and out of the hospital. CSW met with patient at bedside to offer support and discuss resources. Patient stated that he is going to school full time at [employer] and is having problems financial. Patient stated he works at [employer] but has been unable to work due to being consistently sick and being admitted to the hospital. Patient stated he was able to cover his rent for February with the money he had saved from his job, but stated he wont be able to afford March because he has been unable to work the past couple of days. Patient stated  he pays $445/mo  For his campus apartment. Patient stated that his family are very supportive and are all in [place]. Patient stated that his family wants him to move back home and commute back and forth to school. Patient stated he is trying to find someone to take over his lease in April but has not had the opportunity to find anyone to take it over. Patient stated his family is unable to help with rent because they are already paying out of pocket for his tuition.  CSW contact Director [facility] and discussed with her the case. Hope stated unfortunately at this time we are unable to help him with rental assistance. Patient made aware of decision. CSW signing off at this time.  Rhea Pink, MSW,  [place]

## 2016-05-27 ENCOUNTER — Inpatient Hospital Stay (HOSPITAL_COMMUNITY): Payer: BLUE CROSS/BLUE SHIELD

## 2016-05-27 ENCOUNTER — Encounter (HOSPITAL_COMMUNITY): Payer: Self-pay | Admitting: *Deleted

## 2016-05-27 LAB — CBC WITH DIFFERENTIAL/PLATELET
Basophils Absolute: 0 10*3/uL (ref 0.0–0.1)
Basophils Relative: 1 %
Eosinophils Absolute: 1 10*3/uL — ABNORMAL HIGH (ref 0.0–0.7)
Eosinophils Relative: 13 %
HCT: 38 % — ABNORMAL LOW (ref 39.0–52.0)
Hemoglobin: 12.3 g/dL — ABNORMAL LOW (ref 13.0–17.0)
Lymphocytes Relative: 33 %
Lymphs Abs: 2.5 10*3/uL (ref 0.7–4.0)
MCH: 23.2 pg — ABNORMAL LOW (ref 26.0–34.0)
MCHC: 32.4 g/dL (ref 30.0–36.0)
MCV: 71.6 fL — ABNORMAL LOW (ref 78.0–100.0)
Monocytes Absolute: 0.5 10*3/uL (ref 0.1–1.0)
Monocytes Relative: 7 %
Neutro Abs: 3.5 10*3/uL (ref 1.7–7.7)
Neutrophils Relative %: 46 %
Platelets: 204 10*3/uL (ref 150–400)
RBC: 5.31 MIL/uL (ref 4.22–5.81)
RDW: 16.7 % — ABNORMAL HIGH (ref 11.5–15.5)
WBC: 7.5 10*3/uL (ref 4.0–10.5)

## 2016-05-27 NOTE — Progress Notes (Addendum)
[address]       [place] [address]             [phone]         Subjective:  Some discomfort from tube site    Objective: Vital signs in last 24 hours: Temp:  [97.5 F (36.4 C)-98.5 F (36.9 C)] 97.7 F (36.5 C) (02/17 0739) Pulse Rate:  [53-57] 57 (02/17 0739) Cardiac Rhythm: Sinus bradycardia (02/17 0700) Resp:  [18] 18 (02/17 0739) BP: (117-129)/(68-82) 129/82 (02/17 0739) SpO2:  [100 %] 100 % (02/17 0739)  Hemodynamic parameters for last 24 hours:    Intake/Output from previous day: 02/16 0701 - 02/17 0700 In: 1400 [P.O.:1400] Out: 430 [Urine:250; Chest Tube:180] Intake/Output this shift: No intake/output data recorded.  General appearance: alert, cooperative and no distress Heart: regular rate and rhythm Lungs: clear to auscultation bilaterally Abdomen: benign  Lab Results: No results for input(s): WBC, HGB, HCT, PLT in the last 72 hours. BMET:  Recent Labs  05/25/16 1646  NA 140  K 3.4*  CL 100*  CO2 30  GLUCOSE 101*  BUN 14  CREATININE 1.07  CALCIUM 9.8    PT/INR: No results for input(s): LABPROT, INR in the last 72 hours. ABG    Component Value Date/Time   PHART 7.390 04/19/2016 0422   HCO3 28.4 (H) 04/19/2016 0422   O2SAT 99.0 04/19/2016 0422   CBG (last 3)  No results for input(s): GLUCAP in the last 72 hours.  Meds Scheduled Meds: . enoxaparin (LOVENOX) injection  40 mg Subcutaneous Q24H  . sodium chloride flush  3 mL Intravenous Q12H  . sodium chloride flush  3 mL Intravenous Q12H   Continuous Infusions: PRN Meds:.sodium chloride, acetaminophen **OR** acetaminophen, ketorolac, ondansetron **OR** ondansetron (ZOFRAN) IV, oxyCODONE, senna-docusate, sodium chloride flush  Xrays Dg Chest 2 View  Result Date: 05/25/2016 CLINICAL DATA:  Left posterior chest pain and shortness of breath since last night. Status post stapling of blebs 04/18/2016. EXAM: CHEST  2 VIEW COMPARISON:  PA and lateral chest  05/15/2016. FINDINGS: The patient has a left hydropneumothorax. Pneumothorax is estimated at 30%. Pleural effusion is small. Right lung is expanded and clear. No bony abnormality. IMPRESSION: Left hydropneumothorax with a small effusion and pneumothorax estimated at 30%. Critical Value/emergent results were called by telephone at the time of interpretation on 05/25/2016 at 1:53 pm to Shari Heritage, R.N. who verbally acknowledged these results. Electronically Signed   By: Drusilla Kanner M.D.   On: 05/25/2016 13:55   Dg Chest Port 1 View  Result Date: 05/26/2016 CLINICAL DATA:  Chest tube . EXAM: PORTABLE CHEST 1 VIEW COMPARISON:  05/25/2016 . FINDINGS: Left chest tube in stable position. Again prominent left pneumothorax has been resolved. Linear density noted over the mid chest could present with represent residual minimal medial pneumothorax and/or pneumomediastinum. Tiny left pleural effusion. Heart size stable. Mild left chest wall subcutaneous emphysema. IMPRESSION: 1. Left chest tube in stable position. Previously identified prominent pneumothorax on the left has resolved. Minimal residual linear opacity over the medial chest unchanged, this may represent minimal medial free pleural air and/or pneumomediastinum. This is unchanged. 2.  Small left pleural effusion. Electronically Signed   By: Maisie Fus  Register   On: 05/26/2016 08:02   Dg Chest Port 1 View  Result Date: 05/25/2016 CLINICAL DATA:  26 y/o  M; pneumothorax post chest tube placement. EXAM: PORTABLE CHEST 1 VIEW COMPARISON:  05/25/2016 chest radiograph. FINDINGS: Stable normal cardiac silhouette.  Clear lungs. Interval placement of a left-sided chest tube. No appreciable residual left-sided pneumothorax. Bones are unremarkable. IMPRESSION: Left chest tube placement.  No appreciable residual pneumothorax. Electronically Signed   By: Mitzi HansenLance  Furusawa-Stratton M.D.   On: 05/25/2016 18:09    Assessment/Plan: S/P Left chest tube No CXR - ordered  - moving air throughout , no air leak- will place to H20 seal Sinus brady, normal for him, not on any neg chronotropes   LOS: 2 days    GOLD,WAYNE E 05/27/2016   Chart reviewed, patient examined, agree with above. I do not see any air leak and CXR looks ok with no ptx.  Tube is on water seal. I would probably wait until Monday to pull the tube since he has had a recurrent ptx after recent surgery for this.

## 2016-05-28 ENCOUNTER — Encounter (HOSPITAL_COMMUNITY): Payer: Self-pay | Admitting: *Deleted

## 2016-05-28 LAB — CBC
HCT: 37.9 % — ABNORMAL LOW (ref 39.0–52.0)
Hemoglobin: 12.1 g/dL — ABNORMAL LOW (ref 13.0–17.0)
MCH: 22.7 pg — ABNORMAL LOW (ref 26.0–34.0)
MCHC: 31.9 g/dL (ref 30.0–36.0)
MCV: 71.1 fL — ABNORMAL LOW (ref 78.0–100.0)
Platelets: 217 10*3/uL (ref 150–400)
RBC: 5.33 MIL/uL (ref 4.22–5.81)
RDW: 16.4 % — ABNORMAL HIGH (ref 11.5–15.5)
WBC: 6.4 10*3/uL (ref 4.0–10.5)

## 2016-05-28 NOTE — Progress Notes (Addendum)
      [address]       [place] [id number]             [phone]         Subjective: Feels well  Objective: Vital signs in last 24 hours: Temp:  [97.5 F (36.4 C)-98.6 F (37 C)] 97.5 F (36.4 C) (02/18 0543) Pulse Rate:  [51-90] 51 (02/18 0543) Cardiac Rhythm: Sinus bradycardia (02/18 0700) Resp:  [16-18] 16 (02/18 0543) BP: (109-115)/(61-77) 115/77 (02/18 0543) SpO2:  [100 %] 100 % (02/18 0543) Weight:  [134 lb 9.6 oz (61.1 kg)] 134 lb 9.6 oz (61.1 kg) (02/18 0543)  Hemodynamic parameters for last 24 hours:    Intake/Output from previous day: 02/17 0701 - 02/18 0700 In: 960 [P.O.:960] Out: 1391 [Urine:1350; Stool:1; Chest Tube:40] Intake/Output this shift: No intake/output data recorded.  General appearance: alert, cooperative and no distress Heart: regular rate and rhythm Lungs: clear to auscultation bilaterally  Lab Results:  Recent Labs  05/27/16 1622 05/28/16 0250  WBC 7.5 6.4  HGB 12.3* 12.1*  HCT 38.0* 37.9*  PLT 204 217   BMET:  Recent Labs  05/25/16 1646  NA 140  K 3.4*  CL 100*  CO2 30  GLUCOSE 101*  BUN 14  CREATININE 1.07  CALCIUM 9.8    PT/INR: No results for input(s): LABPROT, INR in the last 72 hours. ABG    Component Value Date/Time   PHART 7.390 04/19/2016 0422   HCO3 28.4 (H) 04/19/2016 0422   O2SAT 99.0 04/19/2016 0422   CBG (last 3)  No results for input(s): GLUCAP in the last 72 hours.  Meds Scheduled Meds: . enoxaparin (LOVENOX) injection  40 mg Subcutaneous Q24H  . sodium chloride flush  3 mL Intravenous Q12H  . sodium chloride flush  3 mL Intravenous Q12H   Continuous Infusions: PRN Meds:.sodium chloride, acetaminophen **OR** acetaminophen, ketorolac, ondansetron **OR** ondansetron (ZOFRAN) IV, oxyCODONE, senna-docusate, sodium chloride flush  Xrays Dg Chest Port 1 View  Result Date: 05/27/2016 CLINICAL DATA:  Chest pain.  Recent VATS procedure EXAM: PORTABLE CHEST 1 VIEW COMPARISON:   May 26, 2016 FINDINGS: Chest tube remains in place without evident pneumothorax. Linear opacity to the left of the heart remains. Suspect mild atelectasis. Lungs elsewhere clear. Heart size and pulmonary vascularity are normal. No adenopathy. No bone lesions. IMPRESSION: Probable linear atelectasis slightly lateral to the left heart border. No edema or consolidation. Stable cardiac silhouette. Chest tube remains on the left without pneumothorax. Note that the side port of the chest tube may be just lateral to the pleura on the left. Electronically Signed   By: Bretta BangWilliam  Woodruff III M.D.   On: 05/27/2016 09:16    Assessment/Plan:  1 doing well 2 no air leak, will place to H20 seal, was still on suction this am 3 check CXR in am 4 poss remove tube in am  LOS: 3 days    GOLD,WAYNE E 05/28/2016   Chart reviewed, patient examined, agree with above. CXR looks ok and no air leak or tidaling. Will leave to water seal today and plan to remove in the am. He should have a CXR after tube removal and possibly home later tomorrow.

## 2016-05-29 ENCOUNTER — Inpatient Hospital Stay (HOSPITAL_COMMUNITY): Payer: BLUE CROSS/BLUE SHIELD

## 2016-05-29 LAB — CBC
HCT: 37.7 % — ABNORMAL LOW (ref 39.0–52.0)
Hemoglobin: 12.2 g/dL — ABNORMAL LOW (ref 13.0–17.0)
MCH: 22.8 pg — ABNORMAL LOW (ref 26.0–34.0)
MCHC: 32.4 g/dL (ref 30.0–36.0)
MCV: 70.3 fL — ABNORMAL LOW (ref 78.0–100.0)
Platelets: 243 10*3/uL (ref 150–400)
RBC: 5.36 MIL/uL (ref 4.22–5.81)
RDW: 15.9 % — ABNORMAL HIGH (ref 11.5–15.5)
WBC: 7.2 10*3/uL (ref 4.0–10.5)

## 2016-05-29 NOTE — Progress Notes (Signed)
Patient's chest tube was removed without difficulty.  Will continue to monitor.

## 2016-05-29 NOTE — Progress Notes (Addendum)
      [address]       [place] [id number]             [phone]           Subjective: Patient hopes chest tube is removed today  Objective: Vital signs in last 24 hours: Temp:  [97.5 F (36.4 C)-99.2 F (37.3 C)] 97.5 F (36.4 C) (02/19 0535) Pulse Rate:  [43-66] 43 (02/19 0535) Cardiac Rhythm: Sinus bradycardia (02/18 2200) Resp:  [18] 18 (02/18 1255) BP: (108-127)/(72-75) 108/72 (02/19 0535) SpO2:  [100 %] 100 % (02/19 0535)     Intake/Output from previous day: 02/18 0701 - 02/19 0700 In: 1020 [P.O.:1020] Out: 1390 [Urine:1340; Chest Tube:50]   Physical Exam:  Cardiovascular: RRR Pulmonary: Clear to auscultation bilaterally Chest Tube: to water seal, no air leak  Lab Results: CBC:  Recent Labs  05/28/16 0250 05/29/16 0221  WBC 6.4 7.2  HGB 12.1* 12.2*  HCT 37.9* 37.7*  PLT 217 243   BMET: No results for input(s): NA, K, CL, CO2, GLUCOSE, BUN, CREATININE, CALCIUM in the last 72 hours.  PT/INR: No results for input(s): LABPROT, INR in the last 72 hours. ABG:  INR: Will add last result for INR, ABG once components are confirmed Will add last 4 CBG results once components are confirmed  Assessment/Plan:  1. CV - SR in the 60's  2.  Pulmonary - Chest tube is to water seal.There is no air leak. Await CXR this am. Hope to remove chest tube. Check CXR in am.   ZIMMERMAN,DONIELLE MPA-C 05/29/2016,7:11 AM    Chest tube out today , now just out. Chest xray tomorrow if  ok plan d/c home in am I have seen and examined Karen ChafeMarvin Puchalski and agree with the above assessment  and plan.  Delight OvensEdward B Imani Fiebelkorn MD [facility] [phone] Office [phone] 05/29/2016 11:50 AM

## 2016-05-30 ENCOUNTER — Inpatient Hospital Stay (HOSPITAL_COMMUNITY): Payer: BLUE CROSS/BLUE SHIELD

## 2016-05-30 LAB — CBC
HCT: 39.6 % (ref 39.0–52.0)
Hemoglobin: 12.7 g/dL — ABNORMAL LOW (ref 13.0–17.0)
MCH: 22.6 pg — ABNORMAL LOW (ref 26.0–34.0)
MCHC: 32.1 g/dL (ref 30.0–36.0)
MCV: 70.6 fL — ABNORMAL LOW (ref 78.0–100.0)
Platelets: 261 10*3/uL (ref 150–400)
RBC: 5.61 MIL/uL (ref 4.22–5.81)
RDW: 16.1 % — ABNORMAL HIGH (ref 11.5–15.5)
WBC: 6.7 10*3/uL (ref 4.0–10.5)

## 2016-05-30 MED ORDER — TRAMADOL HCL 50 MG PO TABS: 50.0000 mg | ORAL_TABLET | Freq: Four times a day (QID) | ORAL | 0 refills | Status: DC | PRN

## 2016-05-30 NOTE — Care Management Note (Signed)
Case Management Note Donn PieriniKristi Kassy Mcenroe RN, BSN Unit 2W-Case Manager [phone]  Patient Details  Name: [name] ChafeMarvin [name] MRN: [id number] Date of Birth: 01/06/1991  Subjective/Objective:  Pt admitted with recurrent spontaneous pntx.                   Action/Plan: PTA pt lived at home, independent- plan to return home- no CM needs noted for discharge.   Expected Discharge Date:  05/30/16               Expected Discharge Plan:  Home/Self Care  In-House Referral:     Discharge planning Services  CM Consult  Post Acute Care Choice:  NA Choice offered to:  NA  DME Arranged:    DME Agency:     HH Arranged:    HH Agency:     Status of Service:  Completed, signed off  If discussed at Long Length of Stay Meetings, dates discussed:    Discharge Disposition: home/self care   Additional Comments:  Darrold SpanWebster, Yetzali Weld Hall, RN 05/30/2016, 10:07 AM

## 2016-05-30 NOTE — Progress Notes (Addendum)
      [address]       [place] [id number]             [phone]           Subjective: Patient denies breathing changes or diffiuculty  Objective: Vital signs in last 24 hours: Temp:  [97.5 F (36.4 C)-98 F (36.7 C)] 98 F (36.7 C) (02/20 0457) Pulse Rate:  [60-72] 60 (02/20 0457) Cardiac Rhythm: Normal sinus rhythm (02/19 2010) Resp:  [20] 20 (02/20 0457) BP: (102-127)/(62-71) 126/71 (02/20 0457) SpO2:  [100 %] 100 % (02/20 0457) Weight:  [134 lb 7.7 oz (61 kg)] 134 lb 7.7 oz (61 kg) (02/20 0457)     Intake/Output from previous day: 02/19 0701 - 02/20 0700 In: 720 [P.O.:720] Out: -    Physical Exam:  Cardiovascular: RRR Pulmonary: Clear to auscultation bilaterally Chest Tube: to water seal, no air leak  Lab Results: CBC:  Recent Labs  05/29/16 0221 05/30/16 0303  WBC 7.2 6.7  HGB 12.2* 12.7*  HCT 37.7* 39.6  PLT 243 261   BMET: No results for input(s): NA, K, CL, CO2, GLUCOSE, BUN, CREATININE, CALCIUM in the last 72 hours.  PT/INR: No results for input(s): LABPROT, INR in the last 72 hours. ABG:  INR: Will add last result for INR, ABG once components are confirmed Will add last 4 CBG results once components are confirmed  Assessment/Plan:  1. CV - SR in the 60's  2.  Pulmonary - Chest tube removed yesterday. CXR this am shows left pneumothorax. He does not require a chest tube at this time. 3. Will discharge and follow up CXR in office on Friday   ZIMMERMAN,DONIELLE MPA-C 05/30/2016,7:13 AM

## 2016-06-02 ENCOUNTER — Other Ambulatory Visit: Payer: Self-pay | Admitting: Thoracic Surgery (Cardiothoracic Vascular Surgery)

## 2016-06-02 ENCOUNTER — Ambulatory Visit (INDEPENDENT_AMBULATORY_CARE_PROVIDER_SITE_OTHER): Payer: Self-pay | Admitting: Physician Assistant

## 2016-06-02 ENCOUNTER — Encounter: Payer: Self-pay | Admitting: Physician Assistant

## 2016-06-02 ENCOUNTER — Ambulatory Visit
Admission: RE | Admit: 2016-06-02 | Discharge: 2016-06-02 | Disposition: A | Discharge status: Home / Self Care | Payer: BLUE CROSS/BLUE SHIELD | Source: Ambulatory Visit | Attending: Thoracic Surgery (Cardiothoracic Vascular Surgery) | Admitting: Thoracic Surgery (Cardiothoracic Vascular Surgery)

## 2016-06-02 VITALS — BP 133/77 | HR 64 | Resp 16 | Ht 72.0 in | Wt 133.0 lb

## 2016-06-02 DIAGNOSIS — J939 Pneumothorax, unspecified: Secondary | ICD-10-CM

## 2016-06-02 DIAGNOSIS — Z09 Encounter for follow-up examination after completed treatment for conditions other than malignant neoplasm: Secondary | ICD-10-CM

## 2016-06-02 DIAGNOSIS — J9383 Other pneumothorax: Secondary | ICD-10-CM

## 2016-06-02 NOTE — Progress Notes (Signed)
  HPI:  He previously had a left VATS, stapling of left apical blebs, and mechanical pleurodesis on 04/18/2016. He then developed shortness of breath and left sided chest pain. A chest x ray was obtained and revealed a recurrent left pneumothorax. A chest tube was placed. He was discharged on 05/30/2016. At that time, the chest xray showed a 20% left hydropneumothorax. Patient returns for follow up for recurrent left spontaneous pneumothorax.  Since hospital discharge the patient reports no increasing shortness of breath. Has occasional left sided pain.   Current Outpatient Prescriptions  Medication Sig Dispense Refill  . acetaminophen (TYLENOL) 500 MG tablet Take 2 tablets (1,000 mg total) by mouth every 6 (six) hours as needed for mild pain or fever. (Patient not taking: Reported on 05/25/2016) 30 tablet 0  . traMADol (ULTRAM) 50 MG tablet Take 1 tablet (50 mg total) by mouth every 6 (six) hours as needed for moderate pain (mild pain). 20 tablet 0  Vital Signs: BP 133/77, HR 64, RR 16,  Oxygen saturation 99% on room air   Physical Exam: CV-RRR Pulmonary-Mostly clear  Wound-2 Nylons sutures trimmed. Wound is clean and dry.  Diagnostic Tests: CLINICAL DATA:  Spontaneous pneumothorax history.  EXAM: CHEST  2 VIEW  COMPARISON:  05/30/2016.  FINDINGS: The mediastinal and hilar structures stable. Heart size stable. Stable right sided hydropneumothorax. No change from prior exam. No acute bony abnormality.  IMPRESSION: Stable left-sided hydropneumothorax.  No change from prior exam.   Electronically Signed   By: Maisie Fushomas  Register   On: 06/02/2016 12:51  Impression and Plan: Mr. [name] [name] has a stable left pneumothorax. He does not require a chest tube. He was instructed to not participate in any strenuous activities or fly. He will return to see Dr. Tyrone SageGerhardt in 2 weeks with a chest xray.     Ardelle BallsZIMMERMAN,Everhett Bozard M, PA-C [facility] [phone]  [phone]

## 2016-06-09 ENCOUNTER — Other Ambulatory Visit: Payer: Self-pay | Admitting: Cardiothoracic Surgery

## 2016-06-09 DIAGNOSIS — J9383 Other pneumothorax: Secondary | ICD-10-CM

## 2016-06-13 ENCOUNTER — Encounter: Payer: BLUE CROSS/BLUE SHIELD | Admitting: Cardiothoracic Surgery

## 2016-06-20 ENCOUNTER — Encounter: Payer: Self-pay | Admitting: Cardiothoracic Surgery

## 2016-06-20 ENCOUNTER — Ambulatory Visit (INDEPENDENT_AMBULATORY_CARE_PROVIDER_SITE_OTHER): Payer: Self-pay | Admitting: Cardiothoracic Surgery

## 2016-06-20 ENCOUNTER — Ambulatory Visit
Admission: RE | Admit: 2016-06-20 | Discharge: 2016-06-20 | Disposition: A | Discharge status: Home / Self Care | Payer: BLUE CROSS/BLUE SHIELD | Source: Ambulatory Visit | Attending: Cardiothoracic Surgery | Admitting: Cardiothoracic Surgery

## 2016-06-20 VITALS — BP 131/72 | HR 53 | Resp 16 | Ht 72.0 in | Wt 133.0 lb

## 2016-06-20 DIAGNOSIS — J939 Pneumothorax, unspecified: Secondary | ICD-10-CM

## 2016-06-20 DIAGNOSIS — J9383 Other pneumothorax: Secondary | ICD-10-CM

## 2016-06-20 DIAGNOSIS — Z09 Encounter for follow-up examination after completed treatment for conditions other than malignant neoplasm: Secondary | ICD-10-CM

## 2016-06-20 NOTE — Progress Notes (Signed)
[address]       [place] [address]             [phone]      [facility] Medical Record #[id number] Date of Birth: 1991/02/03  Referring: Kalman Shan, MD Primary Care: No PCP Per Patient  Chief Complaint:   POST OP FOLLOW UP 04/18/2016 DATE OF DISCHARGE: OPERATIVE REPORT PREOPERATIVE DIAGNOSIS:  Recurrent left pneumothoraces. POSTOPERATIVE DIAGNOSIS:  Recurrent left pneumothoraces. SURGICAL PROCEDURE:  Video bronchoscopy, left video-assisted thoracoscopy, stapling of apical blebs, and mechanical pleurodesis. SURGEON:  Sheliah Plane, M.D.  History of Present Illness:     He previously had a left VATS, stapling of left apical blebs, and mechanical pleurodesis on 04/18/2016. He then developed shortness of breath and left sided chest pain. A chest x ray was obtained and revealed a recurrent left pneumothorax. A chest tube was placed. He was discharged on 05/30/2016. At that time, the chest xray showed a 20% left hydropneumothorax.  Patient returns for follow up for recurrent left spontaneous pneumothorax.  Since last seen the patient reports noshortness of breath. Has occasional left sided pain.    Past Medical History:  Diagnosis Date  . Bradycardia, sinus    States he's been told it's an athletic heart rate  . Spontaneous pneumothorax 04/13/2016   previous pneumothorax  in 10/2015     History  Smoking Status  . Former Smoker  . Types: Cigarettes  . Quit date: 01/16/2016  Smokeless Tobacco  . Never Used    Comment: ONLY SMOKED EVERY NOW AND THEN    History  Alcohol Use No    Comment: occasional     No Known Allergies  Current Outpatient Prescriptions  Medication Sig Dispense Refill  . acetaminophen (TYLENOL) 500 MG tablet Take 2 tablets (1,000 mg total) by mouth every 6 (six) hours as needed for mild pain or fever. 30 tablet 0   No current facility-administered medications for this visit.         Physical Exam: BP 131/72 (BP Location: Right Arm, Patient Position: Sitting, Cuff Size: Normal)   Pulse (!) 53   Resp 16   Ht 6' (1.829 m)   Wt 133 lb (60.3 kg)   SpO2 99% Comment: ON RA  BMI 18.04 kg/m   General appearance: alert and cooperative Neurologic: intact Heart: regular rate and rhythm, S1, S2 normal, no murmur, click, rub or gallop Lungs: clear to auscultation bilaterally Abdomen: soft, non-tender; bowel sounds normal; no masses,  no organomegaly Extremities: extremities normal, atraumatic, no cyanosis or edema Wound: chest sutures removed   Diagnostic Studies & Laboratory data:     Recent Radiology Findings:   Dg Chest 2 View  Result Date: 06/20/2016 CLINICAL DATA:  Recurrent spontaneous pneumothorax on the left EXAM: CHEST  2 VIEW COMPARISON:  06/02/2016 FINDINGS: No visible pneumothorax currently. Lungs are clear. Heart is normal size. No effusions or acute bony abnormality. IMPRESSION: No active disease.  No visible current pneumothorax. Electronically Signed   By: Charlett Nose M.D.   On: 06/20/2016 12:40      Recent Lab Findings: Lab Results  Component Value Date   WBC 6.7 05/30/2016   HGB 12.7 (L) 05/30/2016   HCT 39.6 05/30/2016   PLT 261 05/30/2016   GLUCOSE 101 (H) 05/25/2016   ALT 31 04/20/2016   AST 39 04/20/2016   NA 140 05/25/2016   K 3.4 (L) 05/25/2016   CL 100 (L) 05/25/2016   CREATININE  1.07 05/25/2016   BUN 14 05/25/2016   CO2 30 05/25/2016   INR 1.11 04/18/2016      Assessment / Plan:      Stable after left VATS , with stable post op chest xray  Will see patient back for recurrent symptoms  Patient allowed to return to normal activities, cautioned about smoking    Delight OvensEdward B Sayre Witherington MD      [address] [place] [id number] Office [phone]   [facility] [phone]  06/20/2016 8:39 PM

## 2018-02-22 IMAGING — DX DG CHEST 2V
2 series · 2 of 2 positions shown · non-contrast
Comparison: 11/04/2015

CLINICAL DATA: Left-sided chest pain

EXAM:
CHEST  2 VIEW

[w chest pa]
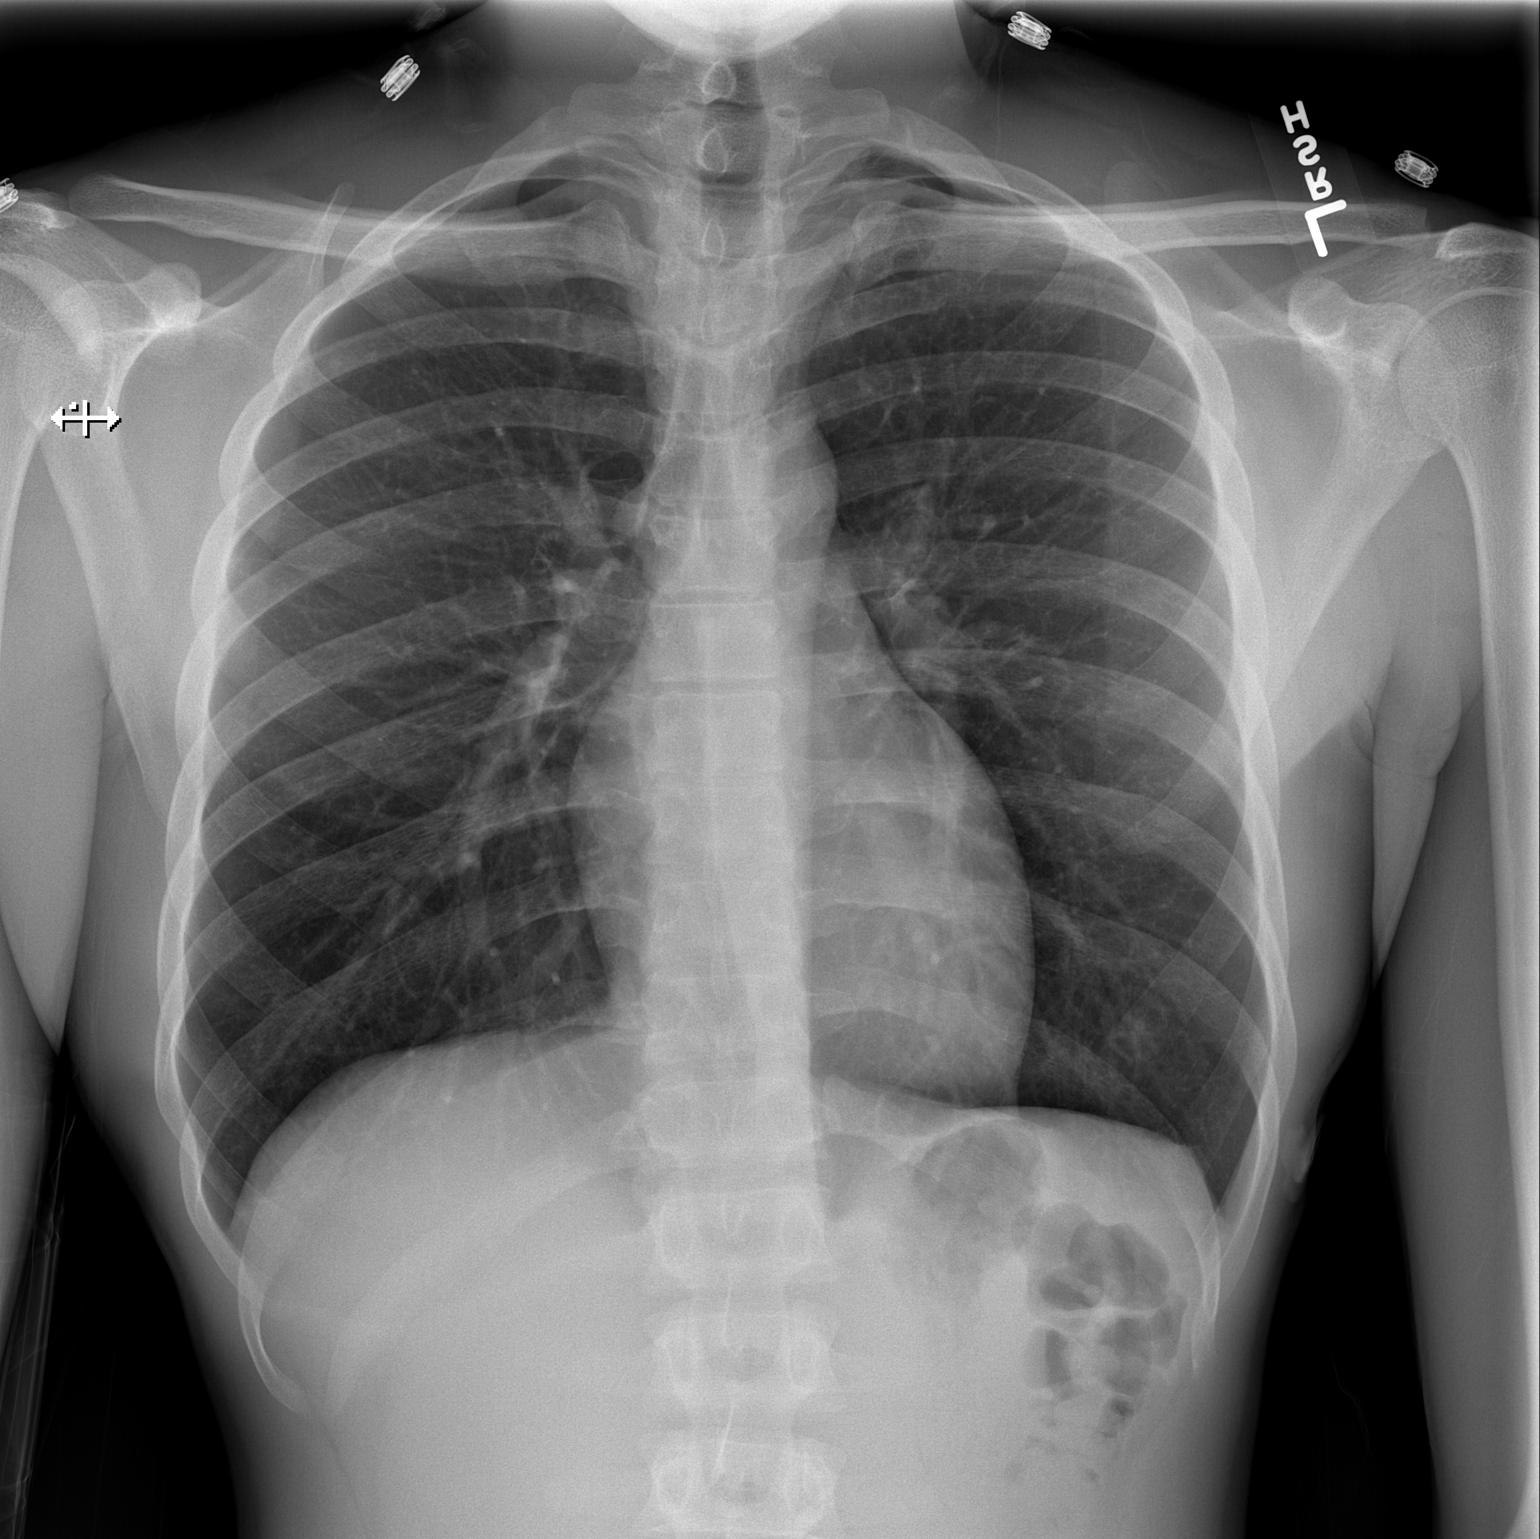

[w chest lat]
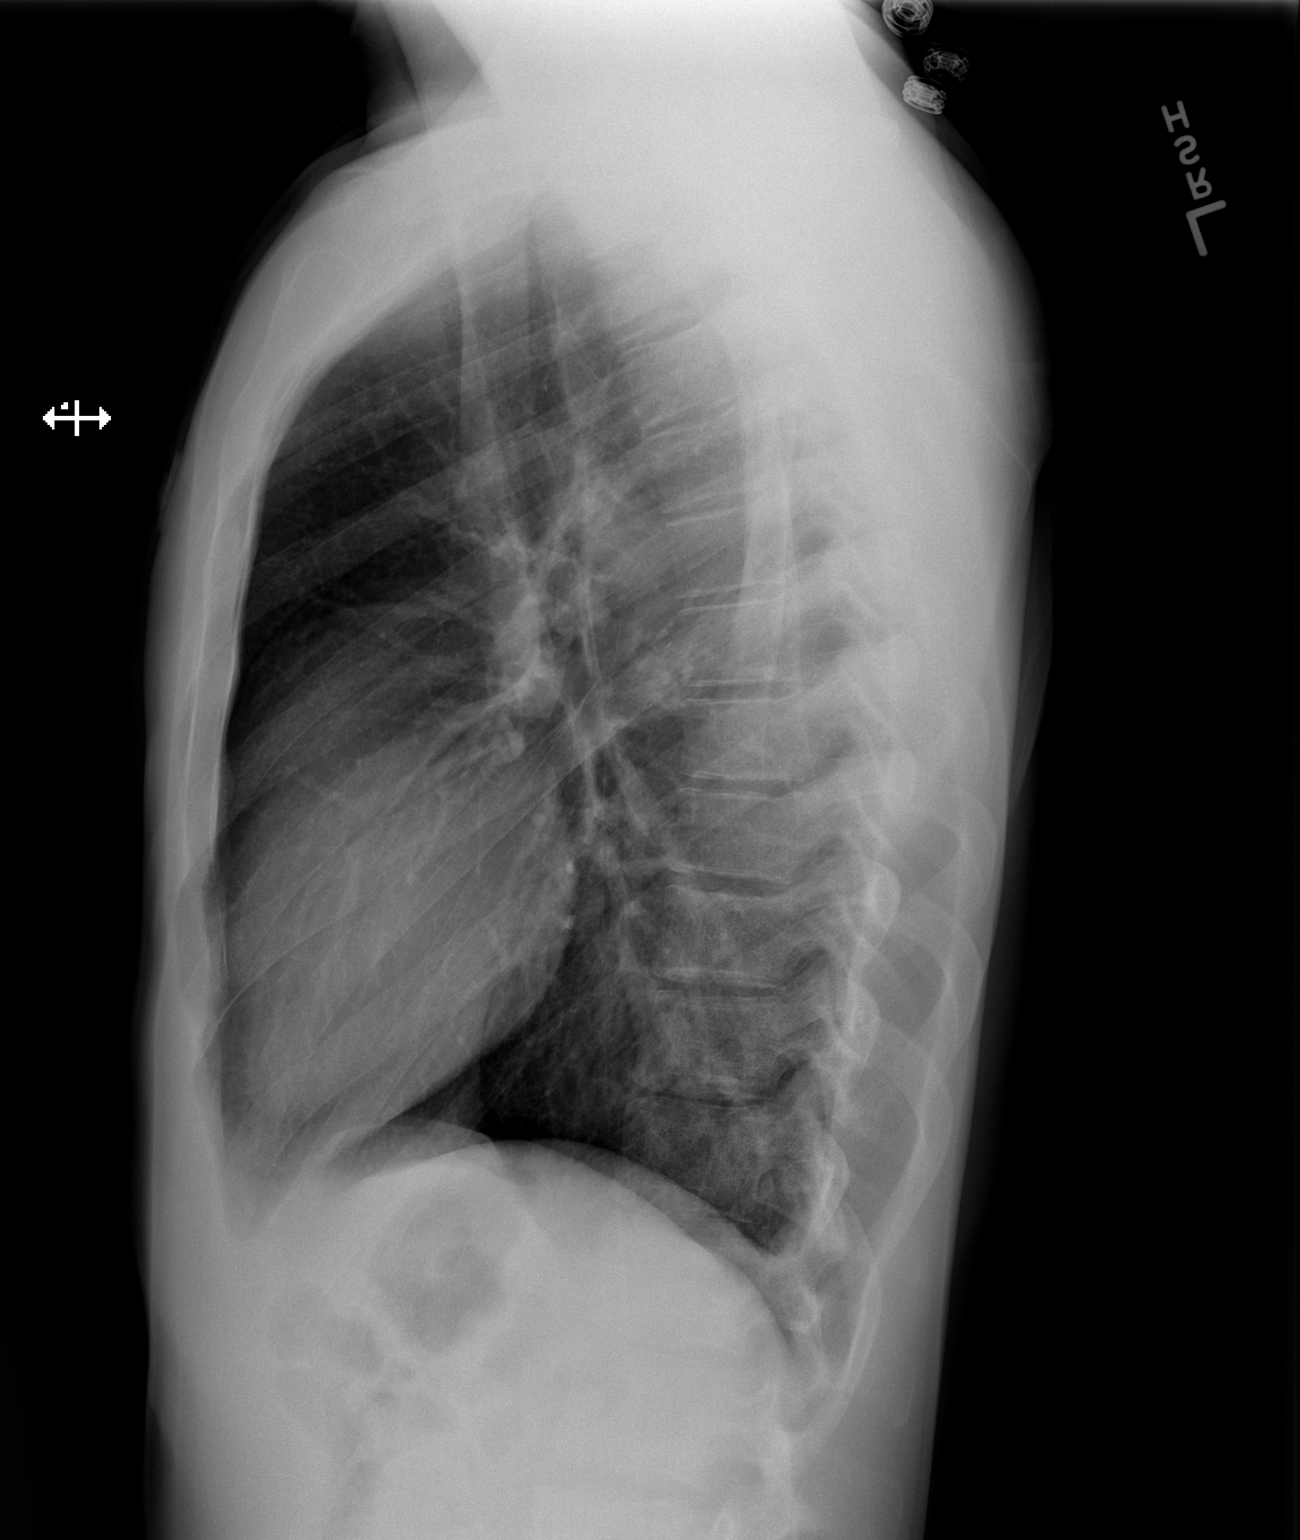

[2 of 2 positions shown; findings below may reference images not displayed]

FINDINGS: There is a small left apical pneumothorax measuring less than 10%.
There is no focal parenchymal opacity. There is no pleural effusion.
There is no right pneumothorax. The heart and mediastinal contours
are unremarkable.

The osseous structures are unremarkable.
IMPRESSION: Small left apical pneumothorax measuring less than 10%.

Critical Value/emergent results were called by telephone at the time
of interpretation on 04/13/2016 at [DATE] to Dr. ZAXARIS GERASIS , who
verbally acknowledged these results.

## 2018-02-24 IMAGING — CR DG CHEST 1V PORT
1 series · 1 of 1 positions shown · non-contrast
Comparison: Yesterday

CLINICAL DATA: Pneumothorax

EXAM:
PORTABLE CHEST 1 VIEW

[AP]
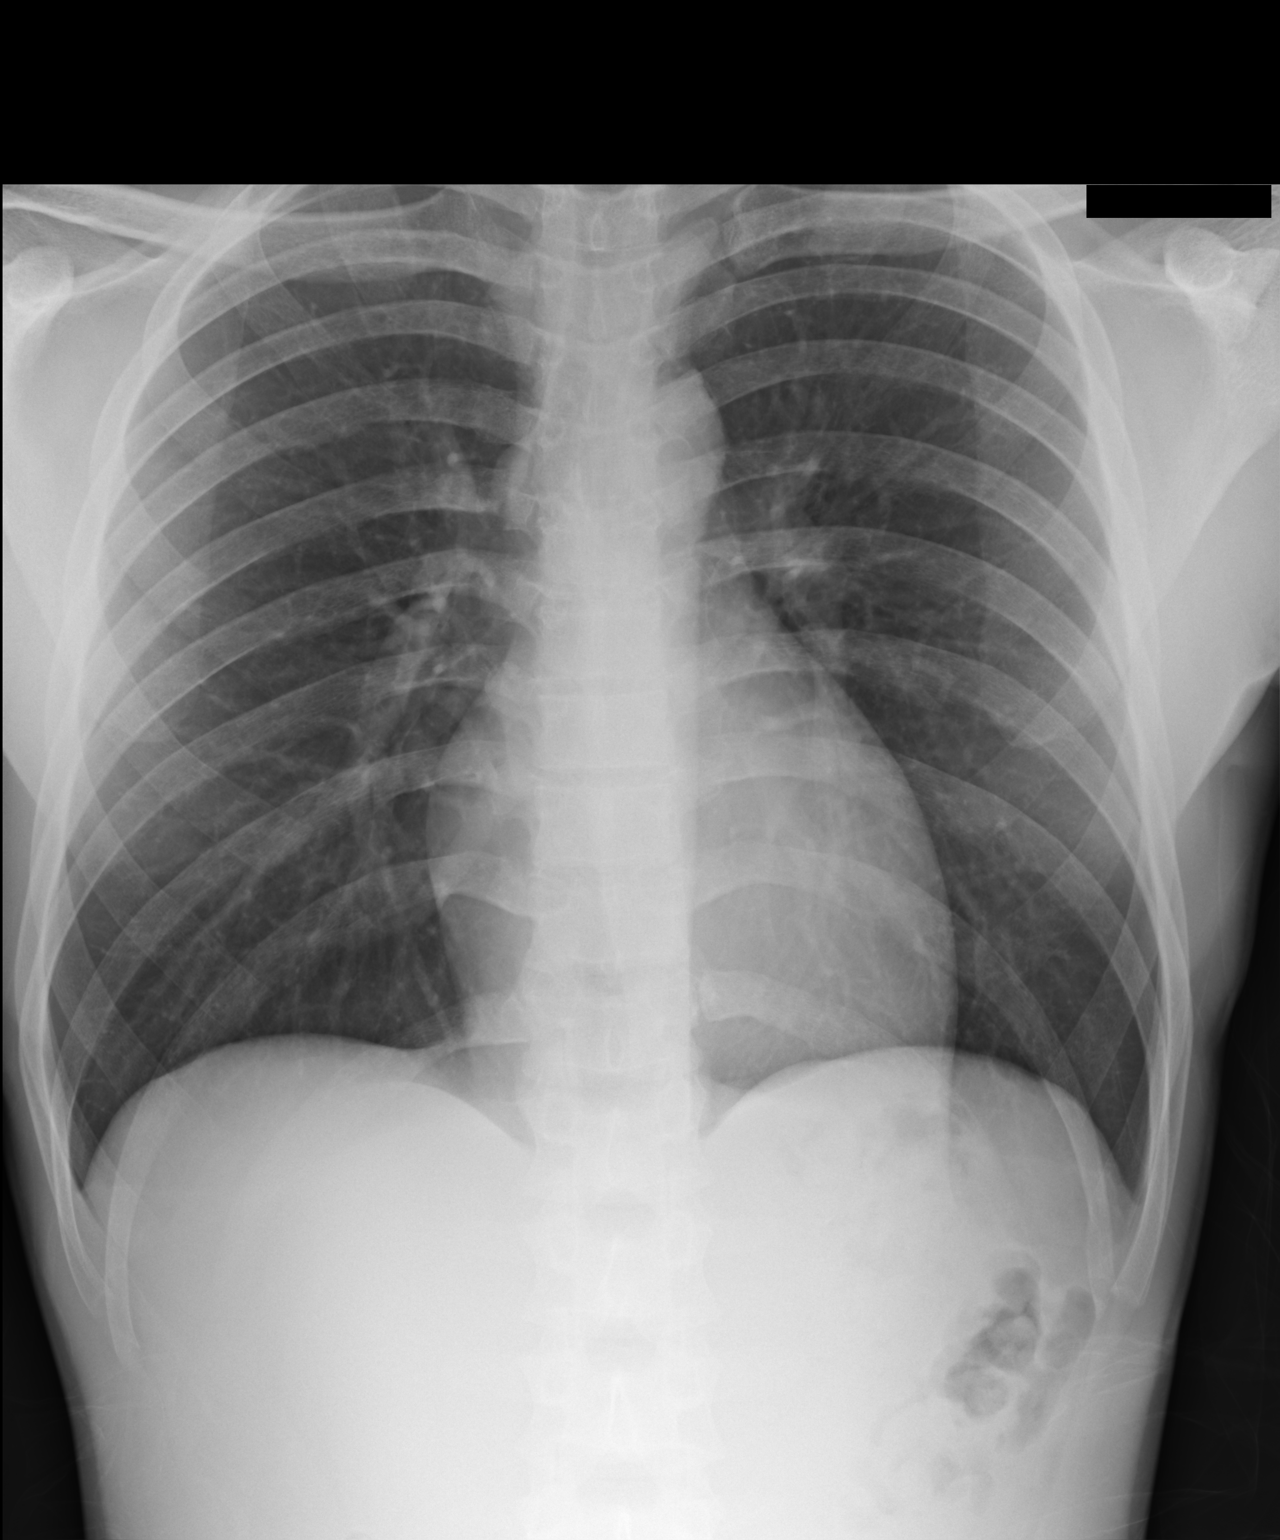

[1 of 1 positions shown; findings below may reference images not displayed]

FINDINGS: Trace left apical pneumothorax which is diminished from yesterday
and admission radiography. Normal heart size and mediastinal
contours. Clear lungs. No osseous findings.
IMPRESSION: Trace and decreased left apical pneumothorax.

## 2018-02-28 IMAGING — CR DG CHEST 1V PORT
1 series · 1 of 1 positions shown · non-contrast
Comparison: 04/18/2016

CLINICAL DATA: Follow-up pneumothorax

EXAM:
PORTABLE CHEST 1 VIEW

[AP]
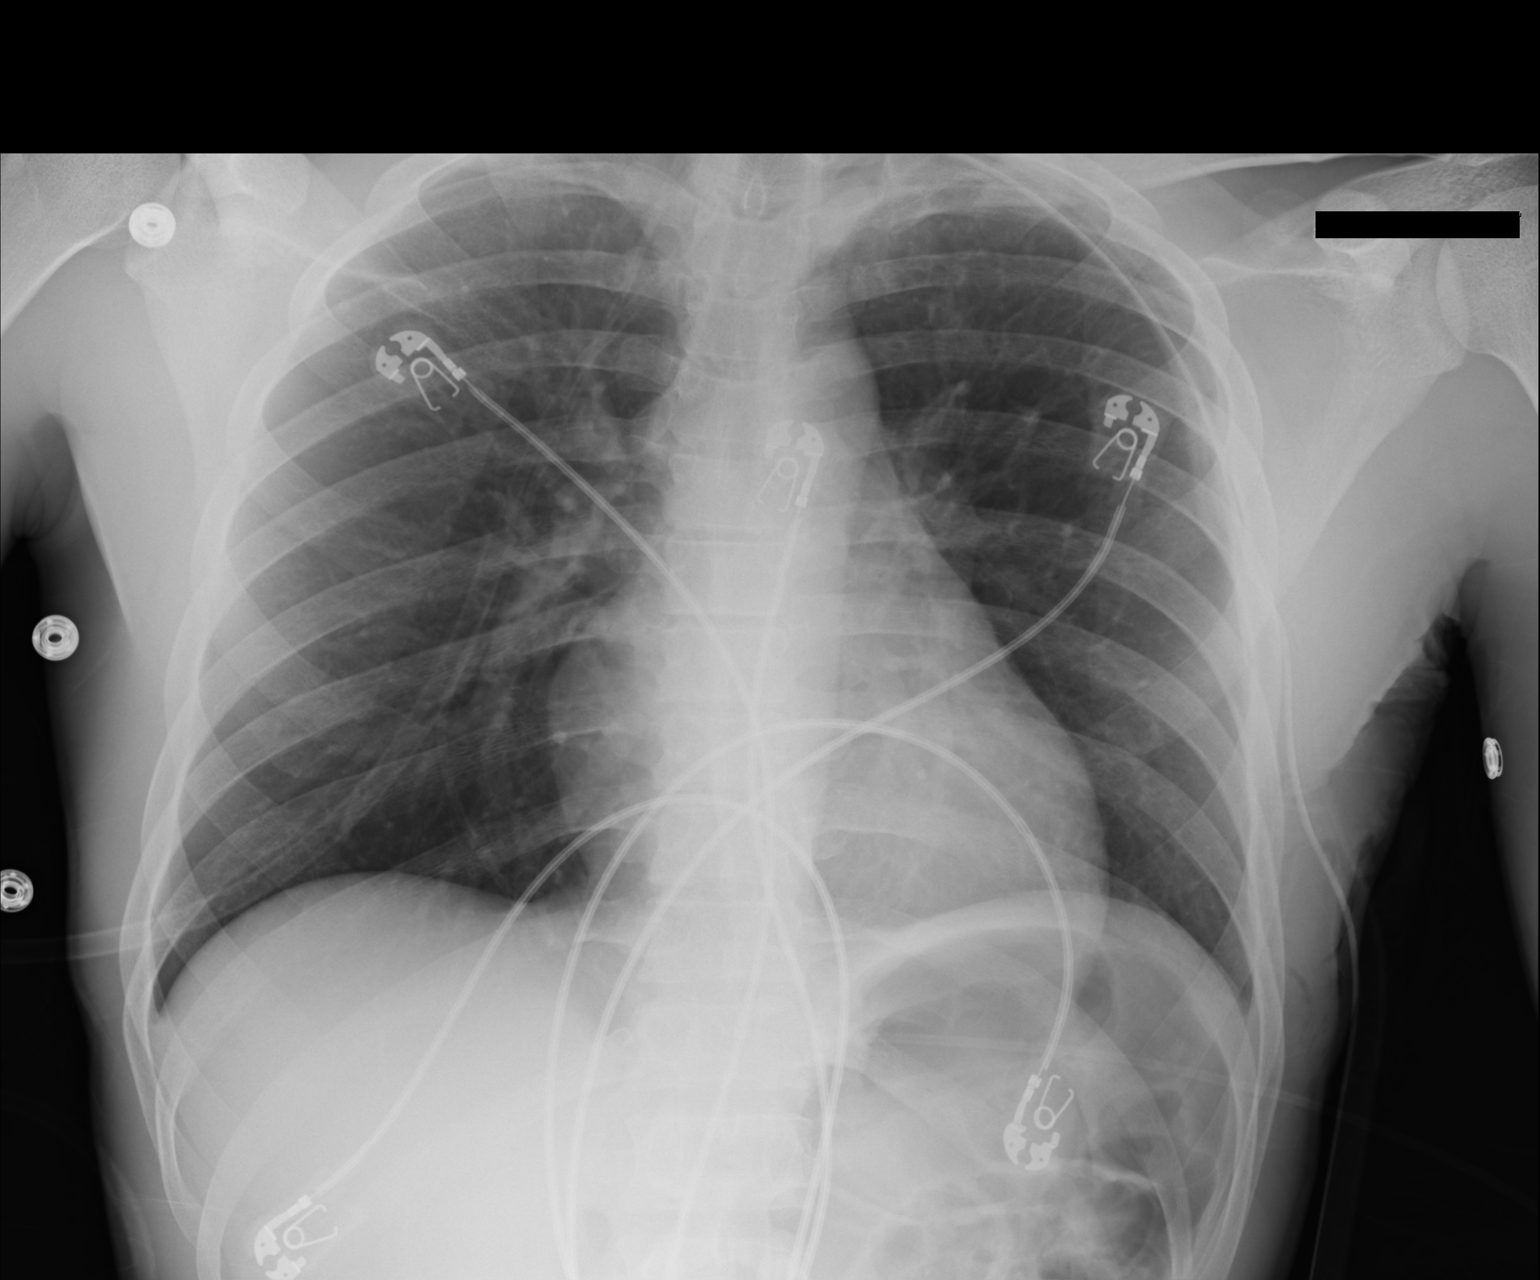

[1 of 1 positions shown; findings below may reference images not displayed]

FINDINGS: Cardiomediastinal silhouette is stable. Left chest tube with tip in
left apex is unchanged in position. No infiltrate or pulmonary
edema. There is no pneumothorax.
IMPRESSION: Stable left chest tube position.  No pneumothorax.

## 2018-04-05 IMAGING — CR DG CHEST 1V PORT
1 series · 1 of 1 positions shown · non-contrast
Comparison: 05/25/2016 chest radiograph.

CLINICAL DATA: 25 y/o  M; pneumothorax post chest tube placement.

EXAM:
PORTABLE CHEST 1 VIEW

[AP]
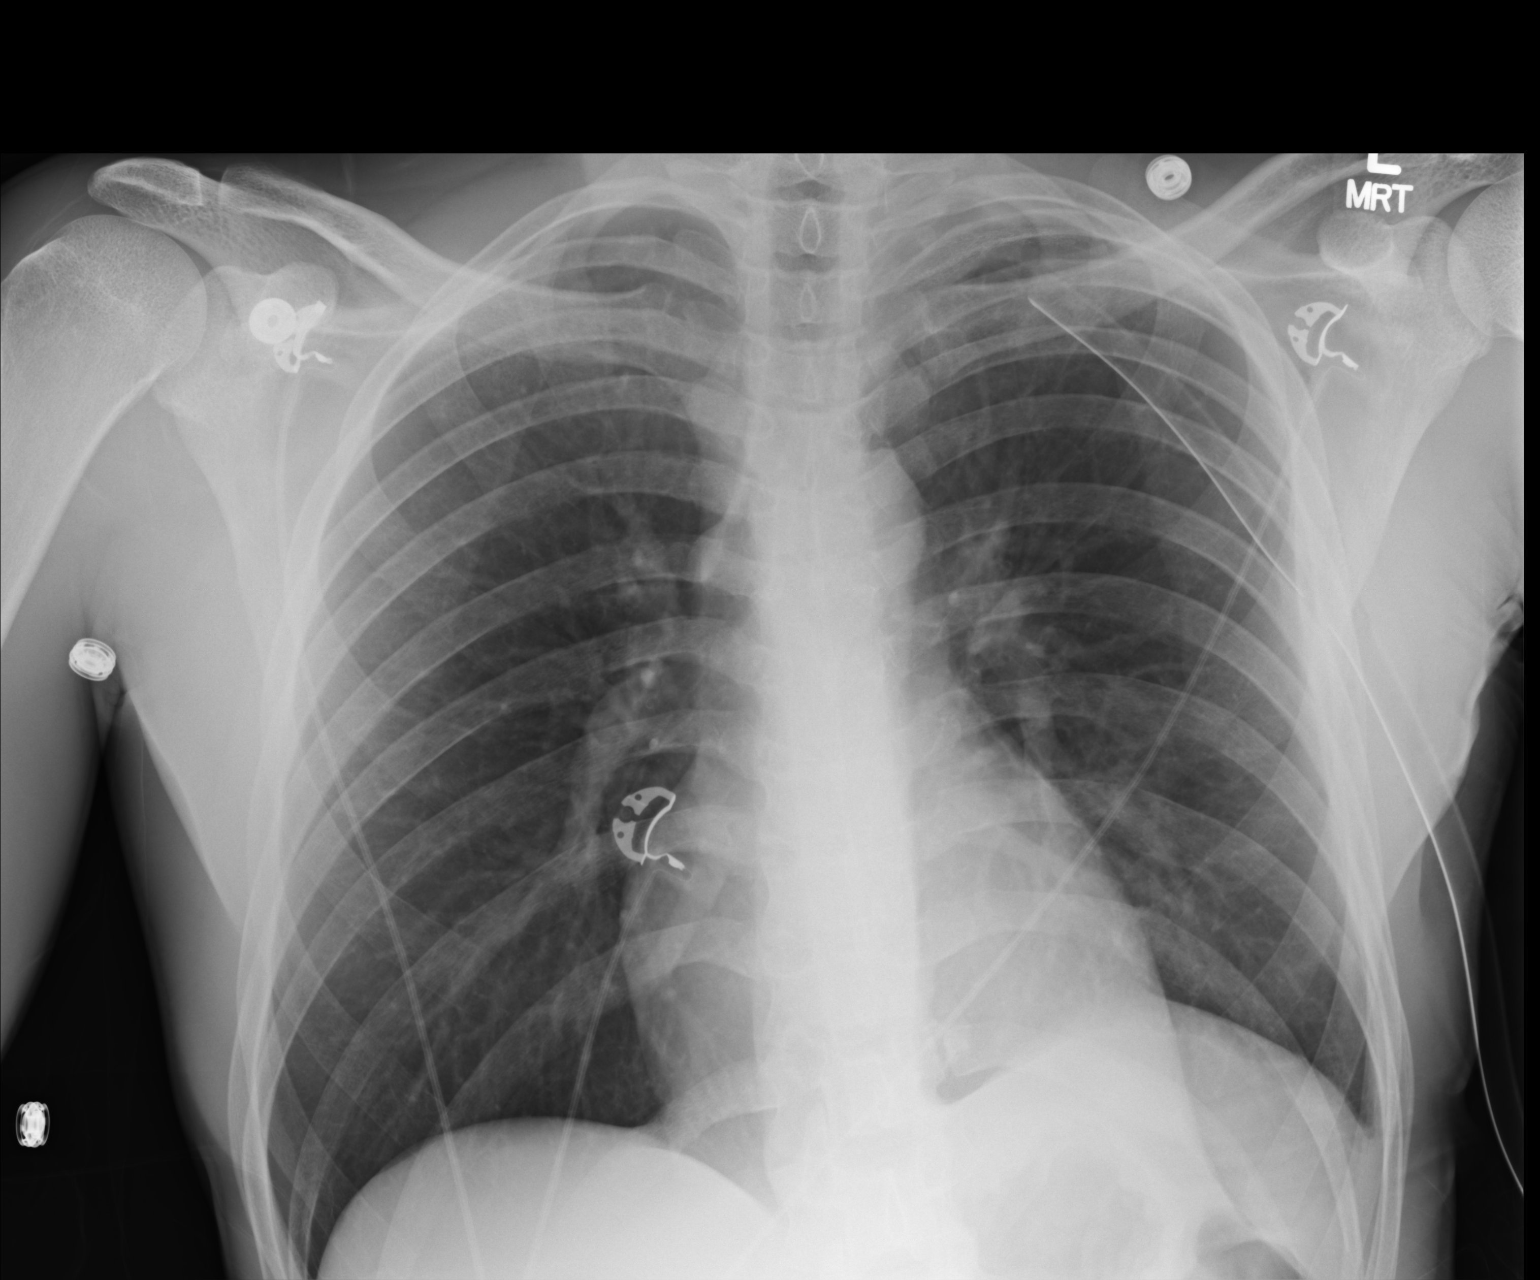

[1 of 1 positions shown; findings below may reference images not displayed]

FINDINGS: Stable normal cardiac silhouette. Clear lungs. Interval placement of
a left-sided chest tube. No appreciable residual left-sided
pneumothorax. Bones are unremarkable.
IMPRESSION: Left chest tube placement.  No appreciable residual pneumothorax.

By: Subagio Tourani M.D.
